# Patient Record
Sex: Female | Born: 1970
Health system: Southern US, Community
[De-identification: ages and names within clinical notes are randomized; demographics above are authoritative.]

## PROBLEM LIST (undated history)

## (undated) DIAGNOSIS — S0300XA Dislocation of jaw, unspecified side, initial encounter: Secondary | ICD-10-CM

## (undated) DIAGNOSIS — G44209 Tension-type headache, unspecified, not intractable: Secondary | ICD-10-CM

## (undated) DIAGNOSIS — O1490 Unspecified pre-eclampsia, unspecified trimester: Secondary | ICD-10-CM

## (undated) DIAGNOSIS — F419 Anxiety disorder, unspecified: Secondary | ICD-10-CM

## (undated) DIAGNOSIS — E039 Hypothyroidism, unspecified: Secondary | ICD-10-CM

## (undated) DIAGNOSIS — O24419 Gestational diabetes mellitus in pregnancy, unspecified control: Secondary | ICD-10-CM

## (undated) DIAGNOSIS — R87619 Unspecified abnormal cytological findings in specimens from cervix uteri: Secondary | ICD-10-CM

## (undated) DIAGNOSIS — I10 Essential (primary) hypertension: Secondary | ICD-10-CM

## (undated) HISTORY — DX: Gestational diabetes mellitus in pregnancy, unspecified control: O24.419

## (undated) HISTORY — DX: Essential (primary) hypertension: I10

## (undated) HISTORY — DX: Anxiety disorder, unspecified: F41.9

## (undated) HISTORY — DX: Tension-type headache, unspecified, not intractable: G44.209

## (undated) HISTORY — DX: Dislocation of jaw, unspecified side, initial encounter: S03.00XA

## (undated) HISTORY — DX: Unspecified pre-eclampsia, unspecified trimester: O14.90

## (undated) HISTORY — DX: Unspecified abnormal cytological findings in specimens from cervix uteri: R87.619

## (undated) HISTORY — DX: Hypothyroidism, unspecified: E03.9

---

## 2007-12-09 ENCOUNTER — Ambulatory Visit: Payer: Self-pay | Admitting: Family Medicine

## 2008-03-30 ENCOUNTER — Other Ambulatory Visit: Payer: Self-pay

## 2008-03-30 ENCOUNTER — Emergency Department: Payer: Self-pay | Admitting: Emergency Medicine

## 2008-09-30 ENCOUNTER — Emergency Department: Payer: Self-pay | Admitting: Emergency Medicine

## 2009-06-29 ENCOUNTER — Ambulatory Visit: Payer: Self-pay | Admitting: General Practice

## 2009-09-07 ENCOUNTER — Emergency Department: Payer: Self-pay | Admitting: Emergency Medicine

## 2010-04-15 ENCOUNTER — Emergency Department: Payer: Self-pay | Admitting: Emergency Medicine

## 2010-09-11 ENCOUNTER — Ambulatory Visit: Payer: Self-pay | Admitting: General Practice

## 2011-05-29 ENCOUNTER — Emergency Department: Payer: Self-pay

## 2012-03-01 ENCOUNTER — Emergency Department: Payer: Self-pay | Admitting: Emergency Medicine

## 2013-02-06 ENCOUNTER — Emergency Department: Payer: Self-pay | Admitting: Emergency Medicine

## 2013-02-15 ENCOUNTER — Encounter: Payer: Self-pay | Admitting: General Practice

## 2013-02-22 ENCOUNTER — Encounter: Payer: Self-pay | Admitting: General Practice

## 2013-02-26 ENCOUNTER — Emergency Department: Payer: Self-pay | Admitting: Emergency Medicine

## 2013-02-26 LAB — CBC
HCT: 37.4 % (ref 35.0–47.0)
MCH: 33.3 pg (ref 26.0–34.0)
MCHC: 35.3 g/dL (ref 32.0–36.0)
MCV: 95 fL (ref 80–100)
RBC: 3.96 10*6/uL (ref 3.80–5.20)
RDW: 12.4 % (ref 11.5–14.5)

## 2013-02-26 LAB — COMPREHENSIVE METABOLIC PANEL
Alkaline Phosphatase: 80 U/L (ref 50–136)
Anion Gap: 3 — ABNORMAL LOW (ref 7–16)
BUN: 7 mg/dL (ref 7–18)
Bilirubin,Total: 0.6 mg/dL (ref 0.2–1.0)
Calcium, Total: 9 mg/dL (ref 8.5–10.1)
Co2: 24 mmol/L (ref 21–32)
Creatinine: 0.56 mg/dL — ABNORMAL LOW (ref 0.60–1.30)
EGFR (Non-African Amer.): >60
Osmolality: 271 (ref 275–301)
Potassium: 3.6 mmol/L (ref 3.5–5.1)
SGPT (ALT): 20 U/L (ref 12–78)
Sodium: 137 mmol/L (ref 136–145)
Total Protein: 7.9 g/dL (ref 6.4–8.2)

## 2013-06-18 ENCOUNTER — Encounter: Payer: Self-pay | Admitting: *Deleted

## 2013-07-01 ENCOUNTER — Ambulatory Visit: Payer: Self-pay | Admitting: Podiatry

## 2014-02-08 ENCOUNTER — Ambulatory Visit: Payer: Self-pay | Admitting: Family Medicine

## 2014-04-06 DIAGNOSIS — M654 Radial styloid tenosynovitis [de Quervain]: Secondary | ICD-10-CM | POA: Insufficient documentation

## 2014-04-06 DIAGNOSIS — R768 Other specified abnormal immunological findings in serum: Secondary | ICD-10-CM | POA: Insufficient documentation

## 2014-05-25 DIAGNOSIS — G43109 Migraine with aura, not intractable, without status migrainosus: Secondary | ICD-10-CM | POA: Insufficient documentation

## 2014-05-25 DIAGNOSIS — E039 Hypothyroidism, unspecified: Secondary | ICD-10-CM | POA: Insufficient documentation

## 2014-06-02 DIAGNOSIS — R51 Headache: Secondary | ICD-10-CM

## 2014-06-02 DIAGNOSIS — R768 Other specified abnormal immunological findings in serum: Secondary | ICD-10-CM | POA: Insufficient documentation

## 2014-06-02 DIAGNOSIS — R519 Headache, unspecified: Secondary | ICD-10-CM | POA: Insufficient documentation

## 2014-09-02 ENCOUNTER — Emergency Department: Payer: Self-pay | Admitting: Emergency Medicine

## 2014-09-28 DIAGNOSIS — M2550 Pain in unspecified joint: Secondary | ICD-10-CM | POA: Insufficient documentation

## 2014-11-11 ENCOUNTER — Other Ambulatory Visit: Payer: Self-pay

## 2014-11-11 ENCOUNTER — Emergency Department
Admission: EM | Admit: 2014-11-11 | Discharge: 2014-11-11 | Disposition: A | Discharge status: Home / Self Care | Payer: 59 | Attending: Emergency Medicine | Admitting: Emergency Medicine

## 2014-11-11 ENCOUNTER — Encounter: Payer: Self-pay | Admitting: Emergency Medicine

## 2014-11-11 DIAGNOSIS — R002 Palpitations: Secondary | ICD-10-CM | POA: Diagnosis present

## 2014-11-11 DIAGNOSIS — Z880 Allergy status to penicillin: Secondary | ICD-10-CM | POA: Diagnosis not present

## 2014-11-11 DIAGNOSIS — R008 Other abnormalities of heart beat: Secondary | ICD-10-CM

## 2014-11-11 DIAGNOSIS — I493 Ventricular premature depolarization: Secondary | ICD-10-CM | POA: Insufficient documentation

## 2014-11-11 DIAGNOSIS — Z79899 Other long term (current) drug therapy: Secondary | ICD-10-CM | POA: Insufficient documentation

## 2014-11-11 DIAGNOSIS — R531 Weakness: Secondary | ICD-10-CM | POA: Insufficient documentation

## 2014-11-11 DIAGNOSIS — Z720 Tobacco use: Secondary | ICD-10-CM | POA: Diagnosis not present

## 2014-11-11 DIAGNOSIS — F419 Anxiety disorder, unspecified: Secondary | ICD-10-CM | POA: Insufficient documentation

## 2014-11-11 LAB — CBC
HCT: 36.6 % (ref 35.0–47.0)
HEMOGLOBIN: 12.7 g/dL (ref 12.0–16.0)
MCH: 32.6 pg (ref 26.0–34.0)
MCHC: 34.9 g/dL (ref 32.0–36.0)
MCV: 93.5 fL (ref 80.0–100.0)
Platelets: 233 10*3/uL (ref 150–440)
RBC: 3.91 MIL/uL (ref 3.80–5.20)
RDW: 12.6 % (ref 11.5–14.5)
WBC: 8.4 10*3/uL (ref 3.6–11.0)

## 2014-11-11 LAB — URINE DRUG SCREEN, QUALITATIVE (ARMC ONLY)
Amphetamines, Ur Screen: NOT DETECTED
BENZODIAZEPINE, UR SCRN: NOT DETECTED
Barbiturates, Ur Screen: NOT DETECTED
Cannabinoid 50 Ng, Ur ~~LOC~~: NOT DETECTED
Cocaine Metabolite,Ur ~~LOC~~: NOT DETECTED
MDMA (ECSTASY) UR SCREEN: NOT DETECTED
Methadone Scn, Ur: NOT DETECTED
OPIATE, UR SCREEN: NOT DETECTED
PHENCYCLIDINE (PCP) UR S: NOT DETECTED
Tricyclic, Ur Screen: NOT DETECTED

## 2014-11-11 LAB — URINALYSIS COMPLETE WITH MICROSCOPIC (ARMC ONLY)
Bacteria, UA: NONE SEEN
Bilirubin Urine: NEGATIVE
Glucose, UA: NEGATIVE mg/dL
HGB URINE DIPSTICK: NEGATIVE
Ketones, ur: NEGATIVE mg/dL
Leukocytes, UA: NEGATIVE
Nitrite: NEGATIVE
PROTEIN: NEGATIVE mg/dL
RBC / HPF: NONE SEEN RBC/hpf (ref 0–5)
SPECIFIC GRAVITY, URINE: 1.003 — AB (ref 1.005–1.030)
SQUAMOUS EPITHELIAL / LPF: NONE SEEN
pH: 6 (ref 5.0–8.0)

## 2014-11-11 LAB — BASIC METABOLIC PANEL
Anion gap: 7 (ref 5–15)
BUN: 14 mg/dL (ref 6–20)
CALCIUM: 9.3 mg/dL (ref 8.9–10.3)
CO2: 27 mmol/L (ref 22–32)
Chloride: 104 mmol/L (ref 101–111)
Creatinine, Ser: 0.63 mg/dL (ref 0.44–1.00)
GFR calc Af Amer: >60 mL/min (ref >60)
Glucose, Bld: 128 mg/dL — ABNORMAL HIGH (ref 65–99)
Potassium: 2.9 mmol/L — CL (ref 3.5–5.1)
Sodium: 138 mmol/L (ref 135–145)

## 2014-11-11 LAB — MAGNESIUM: Magnesium: 1.9 mg/dL (ref 1.7–2.4)

## 2014-11-11 LAB — TROPONIN I: Troponin I: <0.03 ng/mL (ref <0.031)

## 2014-11-11 LAB — TSH: TSH: 1.062 u[IU]/mL (ref 0.350–4.500)

## 2014-11-11 MED ORDER — MAGNESIUM OXIDE 400 (241.3 MG) MG PO TABS
ORAL_TABLET | ORAL | Status: AC
  Filled 2014-11-11: qty 2

## 2014-11-11 MED ORDER — POTASSIUM CHLORIDE CRYS ER 20 MEQ PO TBCR
EXTENDED_RELEASE_TABLET | ORAL | Status: AC
  Filled 2014-11-11: qty 2

## 2014-11-11 MED ORDER — POTASSIUM CHLORIDE CRYS ER 20 MEQ PO TBCR
40.0000 meq | EXTENDED_RELEASE_TABLET | Freq: Once | ORAL | Status: AC
  Administered 2014-11-11: 40 meq via ORAL

## 2014-11-11 MED ORDER — POTASSIUM CHLORIDE ER 10 MEQ PO TBCR: 10.0000 meq | EXTENDED_RELEASE_TABLET | Freq: Two times a day (BID) | ORAL | Status: AC

## 2014-11-11 MED ORDER — MAGNESIUM OXIDE 400 (241.3 MG) MG PO TABS
800.0000 mg | ORAL_TABLET | Freq: Once | ORAL | Status: AC
  Administered 2014-11-11: 800 mg via ORAL

## 2014-11-11 NOTE — ED Provider Notes (Signed)
[facility] Emergency Department Provider Note  ____________________________________________  Time seen: 4 PM  I have reviewed the triage vital signs and the nursing notes.   HISTORY  Chief Complaint Tachycardia and Dizziness   HPI Tiffany Bradford is a 44 y.o. female who was driving home from work when she felt palpitations and dizziness. This happened this early afternoon. The symptoms have continued intermittently. She had stopped the car. She felt she had spots or darkness in her vision. She felt like she was weak and anxious. She had symptoms like this previously. Usually it is only palpitations. She does have a history of hypothyroidism and takes thyroid replacement. She has a history of anxiety and takes clonazepam.  She denies any chest pain. She denies any swelling to either leg. She does have a urinary tract infection that she is currently being treated with ciprofloxacin with.    Past Medical History  Diagnosis Date  . Tension headache   . Anxiety     There are no active problems to display for this patient.   No past surgical history on file.  Current Outpatient Rx  Name  Route  Sig  Dispense  Refill  . cyclobenzaprine (FLEXERIL) 10 MG tablet   Oral   Take 10 mg by mouth at bedtime.         Marland Kitchen. ibuprofen (ADVIL,MOTRIN) 600 MG tablet   Oral   Take 600 mg by mouth every 6 (six) hours as needed.         . potassium chloride (K-DUR) 10 MEQ tablet   Oral   Take 1 tablet (10 mEq total) by mouth 2 (two) times daily.   20 tablet   0     Allergies Penicillins  No family history on file.  Social History History  Substance Use Topics  . Smoking status: Current Every Day Smoker    Types: Cigarettes  . Smokeless tobacco: Not on file  . Alcohol Use: No    Review of Systems  Constitutional: Negative for fever. ENT: Negative for sore throat. Cardiovascular: Palpitations and tachycardia.  Respiratory: Negative for shortness  of breath. Gastrointestinal: Negative for abdominal pain, vomiting and diarrhea. Genitourinary: Negative for dysuria. Musculoskeletal: Negative for back pain. Skin: Negative for rash. Neurological: General weakness, anxiety. See history of present illness.   10-point ROS otherwise negative.  ____________________________________________   PHYSICAL EXAM:  VITAL SIGNS: ED Triage Vitals  Enc Vitals Group     BP --      Pulse --      Resp --      Temp --      Temp src --      SpO2 --      Weight 11/11/14 1527 130 lb (58.968 kg)     Height 11/11/14 1527 5' (1.524 m)     Head Cir --      Peak Flow --      Pain Score --      Pain Loc --      Pain Edu? --      Excl. in GC? --     Constitutional: Alert and oriented. Well appearing and in no distress. ENT   Head: Normocephalic and atraumatic.   Nose: No congestion/rhinnorhea.   Mouth/Throat: Mucous membranes are moist. Cardiovascular: Patient has a regular rhythm and rate for the most part. Her heart rate is 90 and 94. There are only occasional PVCs. This differs from the initial EKG on her arrival which showed bigeminy. Respiratory:  Normal respiratory effort without tachypnea. Breath sounds are clear and equal bilaterally. No wheezes/rales/rhonchi. Gastrointestinal: Soft and nontender. No distention.  Back: There is no CVA tenderness. Musculoskeletal: Nontender with normal range of motion in all extremities.  No noted edema. Neurologic:  Normal speech and language. No gross focal neurologic deficits are appreciated.  Skin:  Skin is warm, dry. No rash noted. Psychiatric: Mood and affect are normal. Speech and behavior are normal.  ____________________________________________    LABS (pertinent positives/negatives)  CBC is normal with white blood cell count of 8.4 and hemoglobin of 12.7.  Metabolic panel is overall normal except for a slightly low potassium level at 2.9 and glucose 128. Troponin is  negative.  Urinalysis is negative. ____________________________________________   EKG  I have interpreted this EKG myself. EKG at 1534 shows sinus tachycardia with frequent premature ventricular complexes in bigeminy. Rate of 111. QTC of 508 area and axis is normal.  ____________________________________________    ____________________________________________   PROCEDURES  ____________________________________________   INITIAL IMPRESSION / ASSESSMENT AND PLAN / ED COURSE  Unclear cause of tachycardia with PVCs. The patient has had similar symptoms in the past. It may be related to her thyroid. Concerned that she may have a medication interaction with her current ciprofloxacin that is triggering this. We are checking electrolytes and magnesium level.  ----------------------------------------- 7:06 PM on 11/11/2014 -----------------------------------------  Recheck of patient shows she is doing better. I saw no PVCs while in the room for approximately 3 minutes. She reports that the palpitations has decreased. Her heart rate is in the mid 80s. We have given her a potassium pill and a magnesium pill. we will continue her on these supplements for the next 5 days. She will follow up with her regular physician.  ____________________________________________   FINAL CLINICAL IMPRESSION(S) / ED DIAGNOSES  Final diagnoses:  Heart palpitations  Premature ventricular contractions  Ventricular bigeminy seen on cardiac monitor      Darien Ramusavid W Sencere Symonette, MD 11/11/14 [phone]

## 2014-11-11 NOTE — ED Notes (Signed)
K 2.9 reported to Dr. Carollee MassedKaminski verbally.

## 2014-11-11 NOTE — ED Notes (Signed)
Pt states she was driving home from working, states she started to have dizziness and her heart felt like it was racing, states "I just don't feel well", denies any cp or sob, pt appears anxious

## 2014-11-11 NOTE — Discharge Instructions (Signed)
Take the potassium replacement prescribed twice a day for 5 days. You may also take magnesium, 250-400 mg twice a day. Follow-up with your regular doctor. Return to the emergency department if you have any further weakness, prolonged palpitations, or other urgent concerns.

## 2014-12-23 IMAGING — CR DG LUMBAR SPINE 2-3V
1 series · 3 of 3 positions shown · non-contrast
Comparison: none

REASON FOR EXAM: acute LBP 2nd to lifting incident
COMMENTS:   LMP: Two weeks ago

[Series 1: t lumbar spine ap · 0.14mm/px · 3 of 3 slices shown]
[im 1/3]
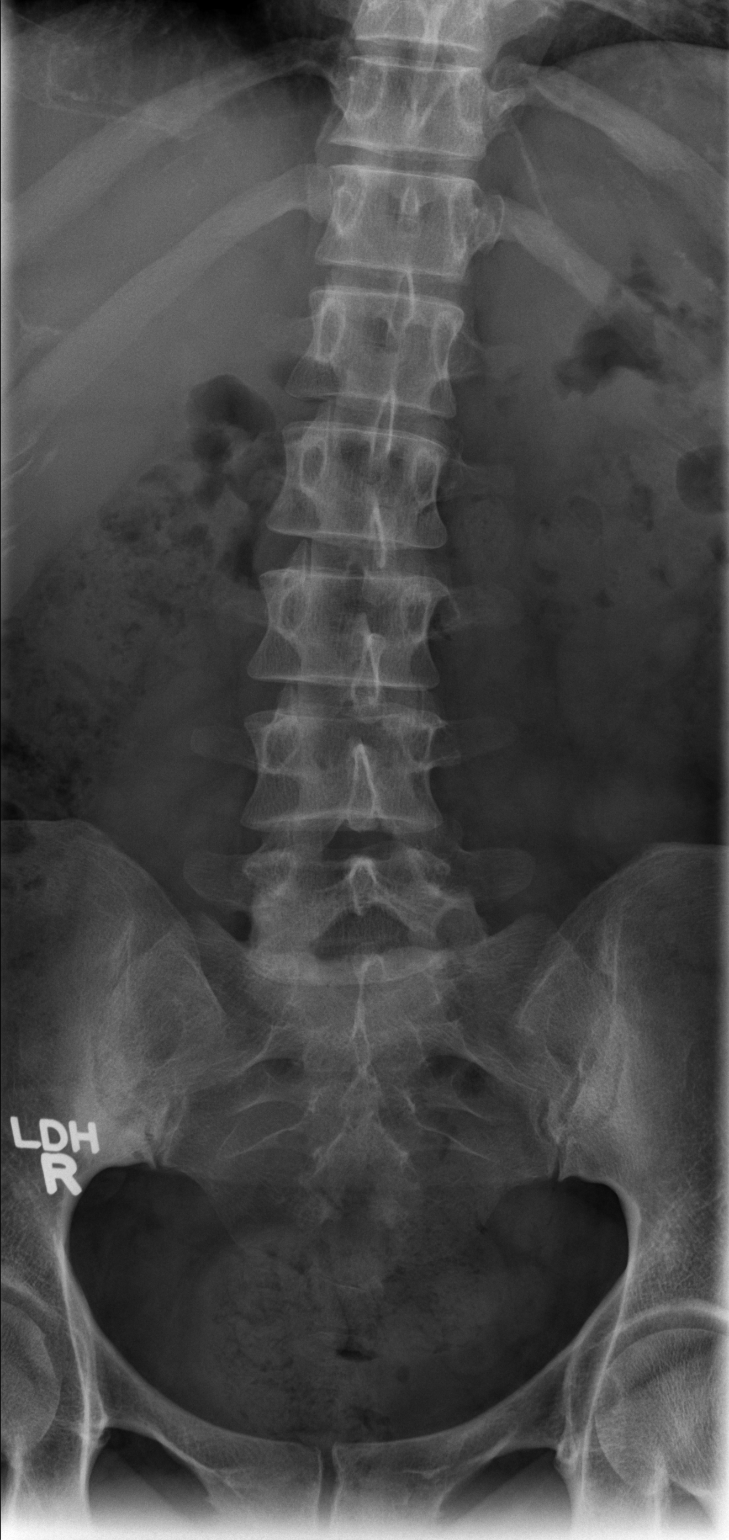
[im 2/3]
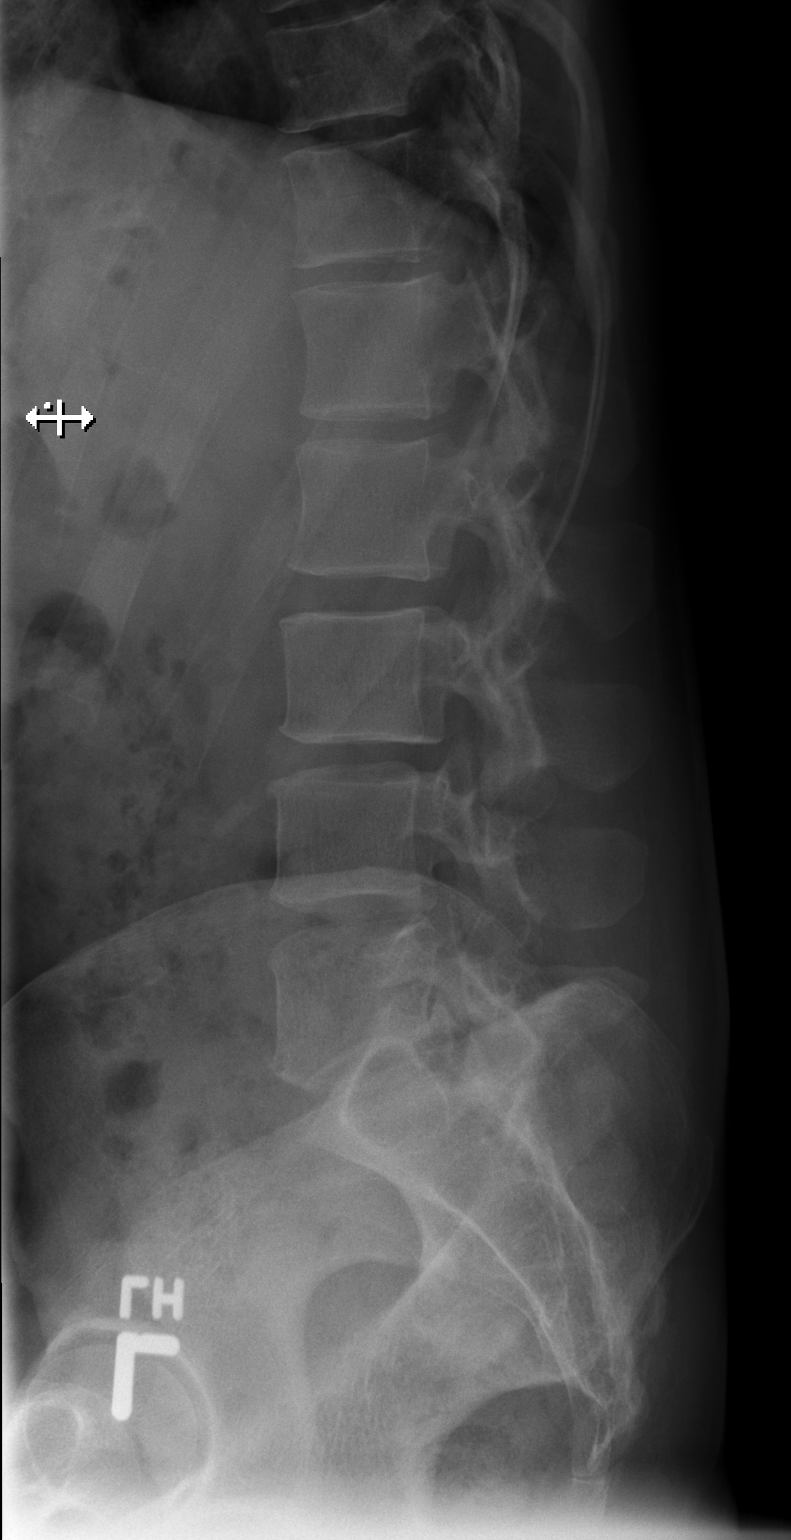
[im 3/3]
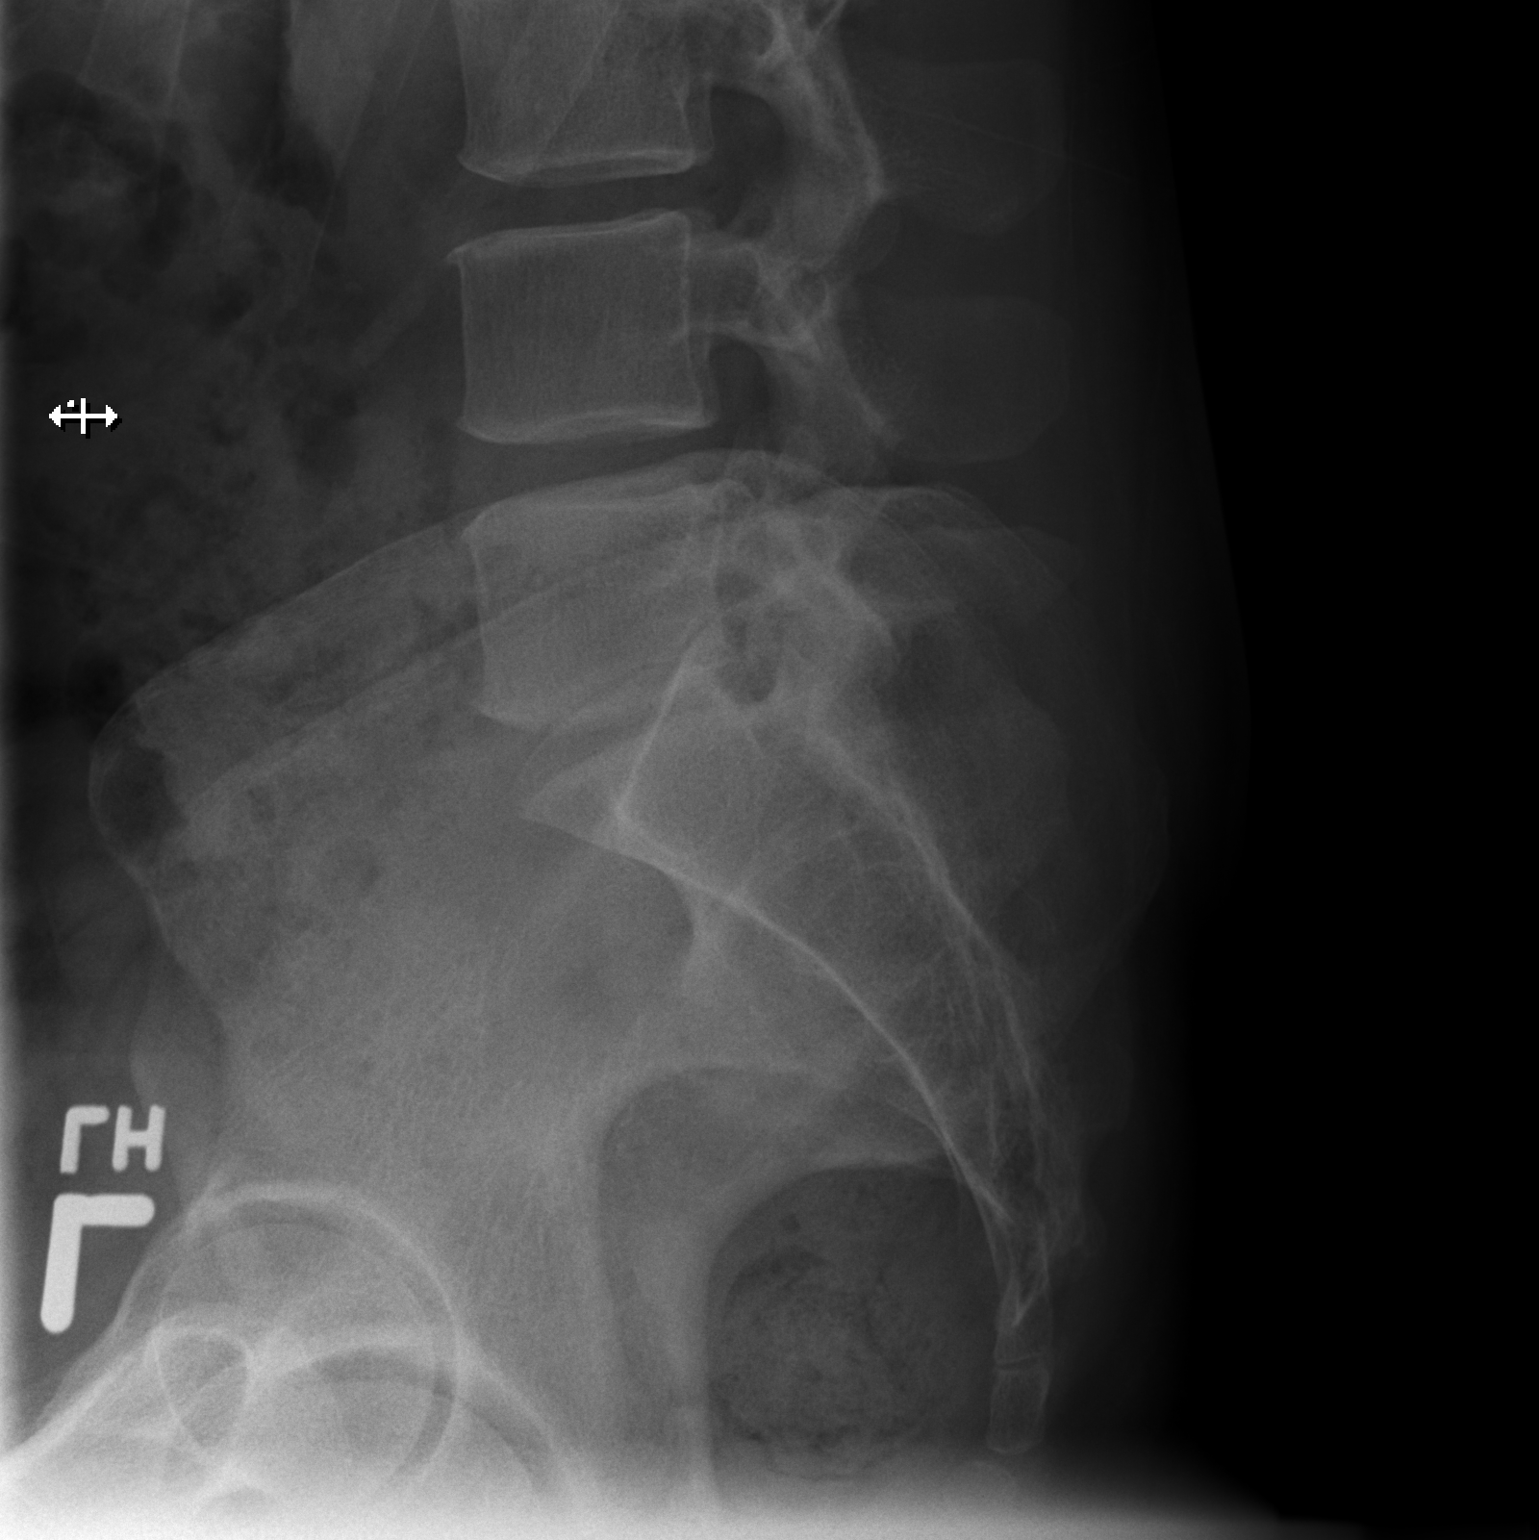

[3 of 3 positions shown; findings below may reference images not displayed]

PROCEDURE:     DXR - DXR LUMBAR SPINE AP AND LATERAL  - February 06, 2013  [DATE]

RESULT:     Images of the lumbar spine show no compression fracture or
subluxation. There is no lytic or sclerotic bony lesion. The bowel gas
pattern appears unremarkable. Some sacroiliac sclerosis is present
consistent with degenerative change.
IMPRESSION: No acute bony abnormality of the lumbar spine evident.

[REDACTED]

## 2014-12-27 ENCOUNTER — Ambulatory Visit: Payer: 59 | Admitting: Family Medicine

## 2015-01-05 ENCOUNTER — Other Ambulatory Visit: Payer: Self-pay | Admitting: Family Medicine

## 2015-01-05 ENCOUNTER — Ambulatory Visit
Admission: RE | Admit: 2015-01-05 | Discharge: 2015-01-05 | Disposition: A | Discharge status: Home / Self Care | Payer: 59 | Source: Ambulatory Visit | Attending: Family Medicine | Admitting: Family Medicine

## 2015-01-05 DIAGNOSIS — M25561 Pain in right knee: Secondary | ICD-10-CM | POA: Diagnosis not present

## 2015-01-05 DIAGNOSIS — M1711 Unilateral primary osteoarthritis, right knee: Secondary | ICD-10-CM | POA: Insufficient documentation

## 2015-01-19 ENCOUNTER — Inpatient Hospital Stay: Payer: 59 | Admitting: Family Medicine

## 2015-06-04 ENCOUNTER — Encounter: Payer: Self-pay | Admitting: Emergency Medicine

## 2015-06-04 ENCOUNTER — Emergency Department
Admission: EM | Admit: 2015-06-04 | Discharge: 2015-06-04 | Disposition: A | Discharge status: Home / Self Care | Payer: 59 | Attending: Emergency Medicine | Admitting: Emergency Medicine

## 2015-06-04 ENCOUNTER — Emergency Department: Payer: 59

## 2015-06-04 DIAGNOSIS — R51 Headache: Secondary | ICD-10-CM | POA: Diagnosis present

## 2015-06-04 DIAGNOSIS — F17210 Nicotine dependence, cigarettes, uncomplicated: Secondary | ICD-10-CM | POA: Insufficient documentation

## 2015-06-04 DIAGNOSIS — Z880 Allergy status to penicillin: Secondary | ICD-10-CM | POA: Diagnosis not present

## 2015-06-04 DIAGNOSIS — G500 Trigeminal neuralgia: Secondary | ICD-10-CM | POA: Insufficient documentation

## 2015-06-04 DIAGNOSIS — Z3202 Encounter for pregnancy test, result negative: Secondary | ICD-10-CM | POA: Insufficient documentation

## 2015-06-04 DIAGNOSIS — Z79899 Other long term (current) drug therapy: Secondary | ICD-10-CM | POA: Insufficient documentation

## 2015-06-04 LAB — BASIC METABOLIC PANEL
Anion gap: 5 (ref 5–15)
BUN: 14 mg/dL (ref 6–20)
CO2: 26 mmol/L (ref 22–32)
Calcium: 9.3 mg/dL (ref 8.9–10.3)
Chloride: 107 mmol/L (ref 101–111)
Creatinine, Ser: 0.44 mg/dL (ref 0.44–1.00)
GFR calc Af Amer: >60 mL/min (ref >60)
Glucose, Bld: 97 mg/dL (ref 65–99)
POTASSIUM: 3.6 mmol/L (ref 3.5–5.1)
Sodium: 138 mmol/L (ref 135–145)

## 2015-06-04 LAB — CBC WITH DIFFERENTIAL/PLATELET
Basophils Absolute: 0.1 10*3/uL (ref 0–0.1)
Basophils Relative: 1 %
EOS PCT: 1 %
Eosinophils Absolute: 0.1 10*3/uL (ref 0–0.7)
HEMATOCRIT: 35.6 % (ref 35.0–47.0)
Hemoglobin: 12.7 g/dL (ref 12.0–16.0)
LYMPHS PCT: 24 %
Lymphs Abs: 1.8 10*3/uL (ref 1.0–3.6)
MCH: 35.6 pg — ABNORMAL HIGH (ref 26.0–34.0)
MCHC: 35.6 g/dL (ref 32.0–36.0)
MCV: 99.9 fL (ref 80.0–100.0)
MONO ABS: 0.5 10*3/uL (ref 0.2–0.9)
Monocytes Relative: 6 %
Neutro Abs: 5.1 10*3/uL (ref 1.4–6.5)
Neutrophils Relative %: 68 %
PLATELETS: 262 10*3/uL (ref 150–440)
RBC: 3.56 MIL/uL — ABNORMAL LOW (ref 3.80–5.20)
RDW: 12.9 % (ref 11.5–14.5)
WBC: 7.5 10*3/uL (ref 3.6–11.0)

## 2015-06-04 LAB — POCT PREGNANCY, URINE: Preg Test, Ur: NEGATIVE

## 2015-06-04 LAB — SEDIMENTATION RATE: Sed Rate: 37 mm/hr — ABNORMAL HIGH (ref 0–20)

## 2015-06-04 MED ORDER — LORAZEPAM 1 MG PO TABS
1.0000 mg | ORAL_TABLET | Freq: Once | ORAL | Status: AC
  Administered 2015-06-04: 1 mg via ORAL
  Filled 2015-06-04: qty 1

## 2015-06-04 MED ORDER — GABAPENTIN 300 MG PO CAPS: 300.0000 mg | ORAL_CAPSULE | Freq: Two times a day (BID) | ORAL | Status: AC

## 2015-06-04 NOTE — ED Notes (Signed)
Patient back from MRI.

## 2015-06-04 NOTE — ED Provider Notes (Signed)
[facility] Emergency Department Provider Note REMINDER - THIS NOTE IS NOT A FINAL MEDICAL RECORD UNTIL IT IS SIGNED. UNTIL THEN, THE CONTENT BELOW MAY REFLECT INFORMATION FROM A DOCUMENTATION TEMPLATE, NOT THE ACTUAL PATIENT VISIT. ____________________________________________  Time seen: Approximately 12:59 PM  I have reviewed the triage vital signs and the nursing notes.   HISTORY  Chief Complaint Headache and Tingling    HPI Tiffany Bradford is a 44 y.o. female reports a previous history of hypertension as well as anxiety.  Patient reports that starting last night around 7 PM she's been having episodes of shooting pain that seems to shoot out from the area around the ear up over the left temple. She has not had a numbness, weakness or tingling except for a tingling sensation over the left side of the head. No fevers or chills. No trouble breathing. Denies having a headache, and at the present time has no pain or symptoms but reports it will come and go a couple times an hour and usually last for about a minute or 2.  She is not pregnant. She is taking a blood pressure medicine only.  No trouble Troubles walking or speaking.  Past Medical History  Diagnosis Date  . Tension headache   . Anxiety     There are no active problems to display for this patient.   No past surgical history on file.  Current Outpatient Rx  Name  Route  Sig  Dispense  Refill  . cyclobenzaprine (FLEXERIL) 10 MG tablet   Oral   Take 10 mg by mouth at bedtime.         . gabapentin (NEURONTIN) 300 MG capsule   Oral   Take 1 capsule (300 mg total) by mouth 2 (two) times daily.   28 capsule   0   . ibuprofen (ADVIL,MOTRIN) 600 MG tablet   Oral   Take 600 mg by mouth every 6 (six) hours as needed.         . potassium chloride (K-DUR) 10 MEQ tablet   Oral   Take 1 tablet (10 mEq total) by mouth 2 (two) times daily.   20 tablet   0      Allergies Penicillins  No family history on file.  Social History Social History  Substance Use Topics  . Smoking status: Current Every Day Smoker    Types: Cigarettes  . Smokeless tobacco: None  . Alcohol Use: No    Review of Systems Constitutional: No fever/chills Eyes: No visual changes. ENT: No sore throat. Cardiovascular: Denies chest pain. Respiratory: Denies shortness of breath. Gastrointestinal: No abdominal pain.  No nausea, no vomiting.  No diarrhea.  No constipation. Genitourinary: Negative for dysuria. Musculoskeletal: Negative for back pain. Skin: Negative for rash. Neurological: Negative for focal weakness  10-point ROS otherwise negative.  ____________________________________________   PHYSICAL EXAM:  VITAL SIGNS: ED Triage Vitals  Enc Vitals Group     BP 06/04/15 1126 154/70 mmHg     Pulse Rate 06/04/15 1126 83     Resp 06/04/15 1126 18     Temp 06/04/15 1126 98.1 F (36.7 C)     Temp Source 06/04/15 1126 Oral     SpO2 06/04/15 1126 100 %     Weight --      Height --      Head Cir --      Peak Flow --      Pain Score 06/04/15 1126 6     Pain  Loc --      Pain Edu? --      Excl. in [place]? --    Constitutional: Alert and oriented. Well appearing and in no acute distress. Eyes: Conjunctivae are normal. PERRL. EOMI. Head: Atraumatic. Nose: No congestion/rhinnorhea. Mouth/Throat: Mucous membranes are moist.  Oropharynx non-erythematous. No temporal artery tenderness. Normal temporal artery pulsations bilaterally. Neck: No stridor.   Cardiovascular: Normal rate, regular rhythm. Grossly normal heart sounds.  Good peripheral circulation. Respiratory: Normal respiratory effort.  No retractions. Lungs CTAB. Gastrointestinal: Soft and nontender. No distention. No abdominal bruits. No CVA tenderness. Musculoskeletal: No lower extremity tenderness nor edema.   Neurologic:  Normal speech and language. No gross focal neurologic deficits are  appreciated. No gait instability.  NIH score equals 0, performed by me at bedside. The patient has no pronator drift. The patient has normal cranial nerve exam. Extraocular movements are normal. Visual fields are normal. Patient has 5 out of 5 strength in all extremities. There is no numbness or gross, acute sensory abnormality in the extremities bilaterally. No speech disturbance. No dysarthria. No aphasia. No ataxia. Normal finger nose finger bilat. Patient speaking in full and clear sentences.   Skin:  Skin is warm, dry and intact. No rash noted. Psychiatric: Mood and affect are normal. Speech and behavior are normal.  ____________________________________________   LABS (all labs ordered are listed, but only abnormal results are displayed)  Labs Reviewed  CBC WITH DIFFERENTIAL/PLATELET - Abnormal; Notable for the following:    RBC 3.56 (*)    MCH 35.6 (*)    All other components within normal limits  SEDIMENTATION RATE - Abnormal; Notable for the following:    Sed Rate 37 (*)    All other components within normal limits  BASIC METABOLIC PANEL  POC URINE PREG, ED  POCT PREGNANCY, URINE   ____________________________________________  EKG   ____________________________________________  RADIOLOGY     MR Brain Wo Contrast (Final result) Result time: 06/04/15 14:46:59   Final result by Rad Results In Interface (06/04/15 14:46:59)   Narrative:   CLINICAL DATA: 44 year old female with numbness burning and tingling of the head since last night. Left upper extremity tingling for 12 hours. No known injury. Initial encounter.  EXAM: MRI HEAD WITHOUT CONTRAST  TECHNIQUE: Multiplanar, multiecho pulse sequences of the brain and surrounding structures were obtained without intravenous contrast.  COMPARISON: Head CT without contrast 02/26/2013. Brain MRI 05/29/2011.  FINDINGS: Cerebral volume remains normal. Major intracranial vascular flow voids are within  normal limits. Pneumatized right anterior clinoid process re- demonstrated. No restricted diffusion to suggest acute infarction. No midline shift, mass effect, evidence of mass lesion, ventriculomegaly, extra-axial collection or acute intracranial hemorrhage. Cervicomedullary junction and pituitary are within normal limits. Negative visualized cervical spine.  Pearline Cables and white matter signal is stable and within normal limits for age.  Visible internal auditory structures appear normal. Mastoids are clear. Paranasal sinuses are clear. Orbit soft tissues are within normal limits. Visualized bone marrow signal is within normal limits.  There is metal susceptibility artifact at the left vertex of the scalp which was not present in 2012 and is not correlated with any finding on the 2014 head CT.  IMPRESSION: 1. No acute intracranial abnormality. Stable and normal for age noncontrast MRI appearance of the brain. 2. New metal susceptibility artifact at the left vertex since 2014. Query new retained foreign body.    ____________________________________________   PROCEDURES  Procedure(s) performed: None  Critical Care performed: No  ____________________________________________   INITIAL  IMPRESSION / ASSESSMENT AND PLAN / ED COURSE  Pertinent labs & imaging results that were available during my care of the patient were reviewed by me and considered in my medical decision making (see chart for details).  Patient presents for staining and paresthesia-like pain over the left temporal region. Neuro intact, normal neurologic exam this time. The pain in distribution over the temporal region that she describes sounds quite a bit like trigeminal neuralgia. I can't explain well the intermittent tingling she experiences in the left forearm, and I discussed her case and care with Dr. Macie Burows, of neurology who advises imaging. If MRI is normal. Advised discharging her on 200 mg Neurontin 3 times  a day with follow-up in neurology clinic. I think this is advisable, I most suspect trigeminal neuralgia as well. We'll obtain MRI to evaluate and further exclude other etiologies such as mild stroke, multiple sclerosis.  ----------------------------------------- 2:53 PM on 06/04/2015 -----------------------------------------  Follow-up with neurology discussed with the patient. Careful return precautions advised. MRI reassuring, no defects. ESR is less than 50, no significant risk for giant cell arteritis. Exam reassuring. Discharge the patient home with a prescription for Neurontin) follow-up. ____________________________________________   FINAL CLINICAL IMPRESSION(S) / ED DIAGNOSES  Final diagnoses:  Trigeminal neuralgia of left side of face      Delman Kitten, MD 06/04/15 1930

## 2015-06-04 NOTE — Discharge Instructions (Signed)
Neuralgia del trigmino (Trigeminal Neuralgia) La neuralgia del trigmino es un trastorno nervioso que causa crisis de dolor facial intenso. Las crisis pueden durar unos segundos o varios minutos. Pueden ocurrir [address], [address] Circlesemanas o Toledomeses, y Clinical biochemistluego desaparecer durante meses o aos. A la neuralgia del trigmino tambin se la conoce como tic doloroso. CAUSAS La causa de esta afeccin es el dao a un nervio del rostro llamado trigmino. Las siguientes acciones pueden desencadenar una crisis:  Hablar.  Masticar.  Maquillarse.  Lavarse el rostro.  Afeitarse el rostro.  Cepillarse los dientes.  Tocarse el rostro. FACTORES DE RIESGO Es ms probable que esta afeccin se manifieste en:  Las mujeres.  Las Smith Internationalpersonas mayores de [id number]. SNTOMAS El sntoma principal de esta afeccin es el dolor en la Pattisonmandbula, los labios, los ojos, la Barboursvillenariz, el cuero cabelludo, la frente y Recruitment consultantel rostro. El dolor puede ser intenso, punzante o similar a un choque elctrico. DIAGNSTICO Esta afeccin se diagnostica mediante un examen fsico. Se puede realizar una tomografa computarizada (TC) o una resonancia magntica (RM) para Engineer, productiondescartar la presencia de otros trastornos que pueden causar Armed forces technical officerdolor facial. TRATAMIENTO El tratamiento de esta afeccin puede incluir lo siguiente:  Automotive engineervitar las cosas que desencadenan las crisis.  Analgsicos.  Ciruga. Esta puede realizarse Circuit Citycuando los casos son graves, si otro tratamiento mdico no brinda alivio. INSTRUCCIONES PARA EL CUIDADO EN EL HOGAR  Tome los medicamentos de venta libre y los recetados solamente como se lo haya indicado el mdico.  Si desea quedar embarazada, hable con el mdico antes de intentarlo.  Evite los factores desencadenantes de las crisis. Puede ser de ayuda:  Masticar del lado de la boca que no est afectado.  Evitar tocarse el rostro.  Evitar las rfagas de aire fro o caliente. SOLICITE ATENCIN MDICA SI:  El medicamento no Hotel managerle calma el  dolor.  Le aparecen sntomas nuevos que no se pueden explicar, por ejemplo:  Visin doble.  Debilidad facial.  Cambios en la audicin o el equilibrio.  Ardelle AntonQueda embarazada. SOLICITE ATENCIN MDICA DE INMEDIATO SI:  El dolor es insoportable, y el analgsico no lo [place]ivia.   Esta informacin no tiene Theme park managercomo fin reemplazar el consejo del mdico. Asegrese de hacerle al mdico cualquier pregunta que tenga.   Document Released: 03/20/2005 Document Revised: 03/01/2015 Elsevier Interactive Patient Education [employer]2016 Elsevier Inc.

## 2015-06-04 NOTE — ED Notes (Signed)
Patient transported to MRI with husband

## 2015-06-04 NOTE — ED Notes (Signed)
Pt reports numbness,burning, tingling in head that started last night.

## 2015-06-08 ENCOUNTER — Ambulatory Visit: Payer: Self-pay | Admitting: Physician Assistant

## 2015-06-08 ENCOUNTER — Encounter: Payer: Self-pay | Admitting: Physician Assistant

## 2015-06-08 VITALS — BP 124/80 | HR 72 | Temp 98.3°F

## 2015-06-08 DIAGNOSIS — J069 Acute upper respiratory infection, unspecified: Secondary | ICD-10-CM

## 2015-06-08 MED ORDER — BENZONATATE 200 MG PO CAPS: 200.0000 mg | ORAL_CAPSULE | Freq: Three times a day (TID) | ORAL | Status: DC | PRN

## 2015-06-08 MED ORDER — AZITHROMYCIN 250 MG PO TABS: ORAL_TABLET | ORAL | Status: DC

## 2015-06-08 NOTE — Progress Notes (Signed)
S: C/o runny nose and congestion for 4 days, no fever, chills, cp/sob, v/d; mucus is green and thick, cough is sporadic, c/o of facial and dental pain.   Using otc meds:   O: PE: vitals wnl, nad,  perrl eomi, normocephalic, tms dull, nasal mucosa red and swollen, throat injected, neck supple no lymph, lungs c t a, cv rrr, neuro intact  A:  Acute sinusitis   P: zpack, tessalon perls, drink fluids, continue regular meds , use otc meds of choice, return if not improving in 5 days, return earlier if worsening

## 2015-06-28 DIAGNOSIS — R51 Headache: Secondary | ICD-10-CM | POA: Diagnosis not present

## 2015-06-28 DIAGNOSIS — M654 Radial styloid tenosynovitis [de Quervain]: Secondary | ICD-10-CM | POA: Diagnosis not present

## 2015-06-28 DIAGNOSIS — G43109 Migraine with aura, not intractable, without status migrainosus: Secondary | ICD-10-CM | POA: Diagnosis not present

## 2015-06-28 DIAGNOSIS — L659 Nonscarring hair loss, unspecified: Secondary | ICD-10-CM | POA: Diagnosis not present

## 2015-06-28 DIAGNOSIS — R768 Other specified abnormal immunological findings in serum: Secondary | ICD-10-CM | POA: Diagnosis not present

## 2015-06-28 DIAGNOSIS — R109 Unspecified abdominal pain: Secondary | ICD-10-CM | POA: Diagnosis not present

## 2015-06-28 DIAGNOSIS — E038 Other specified hypothyroidism: Secondary | ICD-10-CM | POA: Diagnosis not present

## 2015-07-12 ENCOUNTER — Encounter: Payer: Self-pay | Admitting: Physician Assistant

## 2015-07-12 ENCOUNTER — Ambulatory Visit: Payer: Self-pay | Admitting: Physician Assistant

## 2015-07-12 VITALS — BP 130/70 | HR 74 | Temp 98.0°F

## 2015-07-12 DIAGNOSIS — R300 Dysuria: Secondary | ICD-10-CM

## 2015-07-12 LAB — POCT URINALYSIS DIPSTICK
BILIRUBIN UA: NEGATIVE
Blood, UA: NEGATIVE
GLUCOSE UA: NEGATIVE
Ketones, UA: NEGATIVE
LEUKOCYTES UA: NEGATIVE
Nitrite, UA: NEGATIVE
Protein, UA: NEGATIVE
Urobilinogen, UA: 0.2
pH, UA: 6

## 2015-07-12 NOTE — Progress Notes (Signed)
S: c/o low back pain, some burning with urination, no fever/chills, no vaginal discharge, no abd pain  O: vitals wnl, nad, no cva tenderness, lower back nontender to palp, full rom, n/v intact  A: dysuria  P: otc meds

## 2015-07-26 DIAGNOSIS — G44229 Chronic tension-type headache, not intractable: Secondary | ICD-10-CM | POA: Diagnosis not present

## 2015-07-26 DIAGNOSIS — G43109 Migraine with aura, not intractable, without status migrainosus: Secondary | ICD-10-CM | POA: Diagnosis not present

## 2015-07-26 DIAGNOSIS — R51 Headache: Secondary | ICD-10-CM | POA: Diagnosis not present

## 2015-08-22 DIAGNOSIS — J8410 Pulmonary fibrosis, unspecified: Secondary | ICD-10-CM | POA: Diagnosis not present

## 2015-08-22 DIAGNOSIS — R0789 Other chest pain: Secondary | ICD-10-CM | POA: Diagnosis not present

## 2015-08-22 DIAGNOSIS — M329 Systemic lupus erythematosus, unspecified: Secondary | ICD-10-CM | POA: Diagnosis not present

## 2015-08-22 DIAGNOSIS — Z79899 Other long term (current) drug therapy: Secondary | ICD-10-CM | POA: Diagnosis not present

## 2015-08-22 DIAGNOSIS — E039 Hypothyroidism, unspecified: Secondary | ICD-10-CM | POA: Diagnosis not present

## 2015-08-22 DIAGNOSIS — M25512 Pain in left shoulder: Secondary | ICD-10-CM | POA: Diagnosis not present

## 2015-08-22 DIAGNOSIS — F419 Anxiety disorder, unspecified: Secondary | ICD-10-CM | POA: Diagnosis not present

## 2015-08-22 DIAGNOSIS — Z880 Allergy status to penicillin: Secondary | ICD-10-CM | POA: Diagnosis not present

## 2015-08-22 DIAGNOSIS — R079 Chest pain, unspecified: Secondary | ICD-10-CM | POA: Diagnosis not present

## 2015-09-08 DIAGNOSIS — K649 Unspecified hemorrhoids: Secondary | ICD-10-CM | POA: Diagnosis not present

## 2015-09-29 DIAGNOSIS — G44229 Chronic tension-type headache, not intractable: Secondary | ICD-10-CM | POA: Diagnosis not present

## 2015-09-29 DIAGNOSIS — G5622 Lesion of ulnar nerve, left upper limb: Secondary | ICD-10-CM | POA: Diagnosis not present

## 2015-09-29 DIAGNOSIS — S29011A Strain of muscle and tendon of front wall of thorax, initial encounter: Secondary | ICD-10-CM | POA: Diagnosis not present

## 2015-12-29 DIAGNOSIS — F419 Anxiety disorder, unspecified: Secondary | ICD-10-CM | POA: Diagnosis not present

## 2015-12-29 DIAGNOSIS — G44229 Chronic tension-type headache, not intractable: Secondary | ICD-10-CM | POA: Diagnosis not present

## 2016-01-12 DIAGNOSIS — K594 Anal spasm: Secondary | ICD-10-CM | POA: Diagnosis not present

## 2016-01-12 DIAGNOSIS — F419 Anxiety disorder, unspecified: Secondary | ICD-10-CM | POA: Diagnosis not present

## 2016-04-01 DIAGNOSIS — R42 Dizziness and giddiness: Secondary | ICD-10-CM | POA: Diagnosis not present

## 2016-04-01 DIAGNOSIS — E039 Hypothyroidism, unspecified: Secondary | ICD-10-CM | POA: Diagnosis not present

## 2016-04-01 DIAGNOSIS — I10 Essential (primary) hypertension: Secondary | ICD-10-CM | POA: Diagnosis not present

## 2016-04-01 DIAGNOSIS — N924 Excessive bleeding in the premenopausal period: Secondary | ICD-10-CM | POA: Diagnosis not present

## 2016-04-08 ENCOUNTER — Encounter: Payer: Self-pay | Admitting: Emergency Medicine

## 2016-04-08 DIAGNOSIS — R51 Headache: Secondary | ICD-10-CM | POA: Insufficient documentation

## 2016-04-08 DIAGNOSIS — F17210 Nicotine dependence, cigarettes, uncomplicated: Secondary | ICD-10-CM | POA: Insufficient documentation

## 2016-04-08 DIAGNOSIS — Z791 Long term (current) use of non-steroidal anti-inflammatories (NSAID): Secondary | ICD-10-CM | POA: Insufficient documentation

## 2016-04-08 DIAGNOSIS — E039 Hypothyroidism, unspecified: Secondary | ICD-10-CM | POA: Diagnosis not present

## 2016-04-08 DIAGNOSIS — M542 Cervicalgia: Secondary | ICD-10-CM | POA: Insufficient documentation

## 2016-04-08 DIAGNOSIS — Z79899 Other long term (current) drug therapy: Secondary | ICD-10-CM | POA: Insufficient documentation

## 2016-04-08 DIAGNOSIS — M25512 Pain in left shoulder: Secondary | ICD-10-CM | POA: Diagnosis not present

## 2016-04-08 DIAGNOSIS — R42 Dizziness and giddiness: Secondary | ICD-10-CM | POA: Diagnosis not present

## 2016-04-08 LAB — CBC
HEMATOCRIT: 38.4 % (ref 35.0–47.0)
Hemoglobin: 13.4 g/dL (ref 12.0–16.0)
MCH: 32.8 pg (ref 26.0–34.0)
MCHC: 34.8 g/dL (ref 32.0–36.0)
MCV: 94.2 fL (ref 80.0–100.0)
Platelets: 259 10*3/uL (ref 150–440)
RBC: 4.07 MIL/uL (ref 3.80–5.20)
RDW: 12.7 % (ref 11.5–14.5)
WBC: 8.9 10*3/uL (ref 3.6–11.0)

## 2016-04-08 LAB — BASIC METABOLIC PANEL
Anion gap: 7 (ref 5–15)
BUN: 16 mg/dL (ref 6–20)
CHLORIDE: 103 mmol/L (ref 101–111)
CO2: 25 mmol/L (ref 22–32)
Calcium: 9.3 mg/dL (ref 8.9–10.3)
Creatinine, Ser: 0.64 mg/dL (ref 0.44–1.00)
GFR calc Af Amer: >60 mL/min (ref >60)
GFR calc non Af Amer: >60 mL/min (ref >60)
Glucose, Bld: 164 mg/dL — ABNORMAL HIGH (ref 65–99)
POTASSIUM: 3.3 mmol/L — AB (ref 3.5–5.1)
SODIUM: 135 mmol/L (ref 135–145)

## 2016-04-08 LAB — URINALYSIS COMPLETE WITH MICROSCOPIC (ARMC ONLY)
BACTERIA UA: NONE SEEN
Bilirubin Urine: NEGATIVE
Glucose, UA: NEGATIVE mg/dL
Hgb urine dipstick: NEGATIVE
Ketones, ur: NEGATIVE mg/dL
LEUKOCYTES UA: NEGATIVE
Nitrite: NEGATIVE
PH: 6 (ref 5.0–8.0)
PROTEIN: NEGATIVE mg/dL
SQUAMOUS EPITHELIAL / LPF: NONE SEEN
Specific Gravity, Urine: 1.009 (ref 1.005–1.030)

## 2016-04-08 LAB — TROPONIN I: Troponin I: <0.03 ng/mL (ref <0.03)

## 2016-04-08 LAB — POCT PREGNANCY, URINE: PREG TEST UR: NEGATIVE

## 2016-04-08 NOTE — ED Triage Notes (Signed)
Pt ambulatory to triage with steady gait, no distress noted. Pt c/o Headache, dizziness, left shoulder and neck pain since AM. Pt denies chest pain at this time.

## 2016-04-09 ENCOUNTER — Emergency Department
Admission: EM | Admit: 2016-04-09 | Discharge: 2016-04-09 | Disposition: A | Discharge status: Home / Self Care | Payer: 59 | Attending: Emergency Medicine | Admitting: Emergency Medicine

## 2016-04-09 ENCOUNTER — Emergency Department: Payer: 59

## 2016-04-09 DIAGNOSIS — Z791 Long term (current) use of non-steroidal anti-inflammatories (NSAID): Secondary | ICD-10-CM | POA: Diagnosis not present

## 2016-04-09 DIAGNOSIS — Z79899 Other long term (current) drug therapy: Secondary | ICD-10-CM | POA: Diagnosis not present

## 2016-04-09 DIAGNOSIS — M542 Cervicalgia: Secondary | ICD-10-CM

## 2016-04-09 DIAGNOSIS — R51 Headache: Secondary | ICD-10-CM | POA: Diagnosis not present

## 2016-04-09 DIAGNOSIS — E039 Hypothyroidism, unspecified: Secondary | ICD-10-CM | POA: Diagnosis not present

## 2016-04-09 DIAGNOSIS — R42 Dizziness and giddiness: Secondary | ICD-10-CM | POA: Diagnosis not present

## 2016-04-09 DIAGNOSIS — R519 Headache, unspecified: Secondary | ICD-10-CM

## 2016-04-09 DIAGNOSIS — F17210 Nicotine dependence, cigarettes, uncomplicated: Secondary | ICD-10-CM | POA: Diagnosis not present

## 2016-04-09 DIAGNOSIS — M25512 Pain in left shoulder: Secondary | ICD-10-CM | POA: Diagnosis not present

## 2016-04-09 MED ORDER — IBUPROFEN 400 MG PO TABS
400.0000 mg | ORAL_TABLET | Freq: Once | ORAL | Status: AC
  Administered 2016-04-09: 400 mg via ORAL
  Filled 2016-04-09: qty 1

## 2016-04-09 NOTE — ED Notes (Signed)
Discharge instructions reviewed with patient. Patient verbalized understanding. Patient ambulated to lobby without difficulty.   

## 2016-04-09 NOTE — ED Provider Notes (Signed)
[facility] Emergency Department Provider Note    First MD Initiated Contact with Patient 04/09/16 0040     (approximate)  I have reviewed the triage vital signs and the nursing notes.   HISTORY  Chief Complaint Headache; Dizziness; and Shoulder Pain   HPI Tiffany Bradford is a 45 y.o. female presents with headache dizziness and left shoulder pain associated with left neck pain since this morning. Patient denies any weakness no numbness gait instability or visual changes. Patient admits to previous episodes of the same for which she's had an MRI performed   Past Medical History:  Diagnosis Date  . Anxiety   . Tension headache     Patient Active Problem List   Diagnosis Date Noted  . Ache in joint 09/28/2014  . 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 Headache 06/02/2014  . Acute onset aura migraine 05/25/2014  . Adult hypothyroidism 05/25/2014  . ANA positive 04/06/2014  . De Quervain's disease (radial styloid tenosynovitis) 04/06/2014    History reviewed. No pertinent surgical history.  Prior to Admission medications   Medication Sig Start Date End Date Taking? Authorizing Provider  clonazePAM (KLONOPIN) 0.5 MG tablet Take 0.5 mg by mouth 2 (two) times daily as needed for anxiety.    Historical Provider, MD  cyclobenzaprine (FLEXERIL) 10 MG tablet Take 10 mg by mouth at bedtime. Reported on 07/12/2015    Historical Provider, MD  gabapentin (NEURONTIN) 300 MG capsule Take 1 capsule (300 mg total) by mouth 2 (two) times daily. Patient not taking: Reported on 07/12/2015 06/04/15   Sharyn CreamerMark Quale, MD  ibuprofen (ADVIL,MOTRIN) 600 MG tablet Take 600 mg by mouth. 06/28/15   Historical Provider, MD  levothyroxine (SYNTHROID, LEVOTHROID) 88 MCG tablet  05/17/15   Historical Provider, MD  lisinopril (PRINIVIL,ZESTRIL) 10 MG tablet  06/02/15   Historical Provider, MD  potassium chloride (K-DUR) 10 MEQ tablet Take 1 tablet (10 mEq total) by mouth 2 (two)  times daily. Patient not taking: Reported on 07/12/2015 11/11/14   Darien Ramusavid W Kaminski, MD    Allergies Penicillins  History reviewed. No pertinent family history.  Social History Social History  Substance Use Topics  . Smoking status: Current Every Day Smoker    Types: Cigarettes  . Smokeless tobacco: Never Used  . Alcohol use No    Review of Systems Constitutional: No fever/chills Eyes: No visual changes. ENT: No sore throat. Cardiovascular: Denies chest pain. Respiratory: Denies shortness of breath. Gastrointestinal: No abdominal pain.  No nausea, no vomiting.  No diarrhea.  No constipation. Genitourinary: Negative for dysuria. Musculoskeletal: Negative for back pain. Skin: Negative for rash. Neurological: Positive for headache  10-point ROS otherwise negative.  ____________________________________________   PHYSICAL EXAM:  VITAL SIGNS: ED Triage Vitals  Enc Vitals Group     BP 04/08/16 2055 (!) 160/89     Pulse Rate 04/08/16 2055 92     Resp 04/08/16 2055 15     Temp 04/08/16 2055 97.8 F (36.6 C)     Temp Source 04/08/16 2055 Oral     SpO2 04/08/16 2055 97 %     Weight 04/08/16 2056 131 lb (59.4 kg)     Height 04/08/16 2056 5\' 1"  (1.549 m)     Head Circumference --      Peak Flow --      Pain Score 04/08/16 2056 7     Pain Loc --      Pain Edu? --      Excl. in GC? --  Constitutional: Alert and oriented. Well appearing and in no acute distress. Eyes: Conjunctivae are normal. PERRL. EOMI. Head: Atraumatic. Ears:  Healthy appearing ear canals and TMs bilaterally Nose: No congestion/rhinnorhea. Mouth/Throat: Mucous membranes are moist.  Oropharynx non-erythematous. Neck: No stridor.  No meningeal signs.  No cervical spine tenderness to palpation. Cardiovascular: Normal rate, regular rhythm. Good peripheral circulation. Grossly normal heart sounds. Respiratory: Normal respiratory effort.  No retractions. Lungs CTAB. Gastrointestinal: Soft and  nontender. No distention.  Musculoskeletal: No lower extremity tenderness nor edema. No gross deformities of extremities. Neurologic:  Normal speech and language. No gross focal neurologic deficits are appreciated.  Skin:  Skin is warm, dry and intact. No rash noted. Psychiatric: Mood and affect are normal. Speech and behavior are normal.  ____________________________________________   LABS (all labs ordered are listed, but only abnormal results are displayed)  Labs Reviewed  BASIC METABOLIC PANEL - Abnormal; Notable for the following:       Result Value   Potassium 3.3 (*)    Glucose, Bld 164 (*)    All other components within normal limits  URINALYSIS COMPLETEWITH MICROSCOPIC ([facility] ONLY) - Abnormal; Notable for the following:    Color, Urine STRAW (*)    APPearance CLEAR (*)    All other components within normal limits  CBC  TROPONIN I  POC URINE PREG, ED  POCT PREGNANCY, URINE    RADIOLOGY I, Welsh N Naliyah Neth, personally viewed and evaluated these images (plain radiographs) as part of my medical decision making, as well as reviewing the written report by the radiologist.  CLINICAL DATA:  45 year old female with headache and dizziness and left-sided neck pain.  EXAM: CT HEAD WITHOUT CONTRAST  CT CERVICAL SPINE WITHOUT CONTRAST  TECHNIQUE: Multidetector CT imaging of the head and cervical spine was performed following the standard protocol without intravenous contrast. Multiplanar CT image reconstructions of the cervical spine were also generated.  COMPARISON:  Brain MRI dated 06/04/2015  FINDINGS: CT HEAD FINDINGS  Brain: No evidence of acute infarction, hemorrhage, hydrocephalus, extra-axial collection or mass lesion/mass effect.  Vascular: No hyperdense vessel or unexpected calcification.  Skull: Normal. Negative for fracture or focal lesion.  Sinuses/Orbits: No acute finding.  Other: None  CT CERVICAL SPINE FINDINGS  Alignment:  Normal.  Skull base and vertebrae: No acute fracture. No primary bone lesion or focal pathologic process.  Soft tissues and spinal canal: No prevertebral fluid or swelling. No visible canal hematoma.  Disc levels:  No acute findings  Upper chest: Negative.  Other: None  IMPRESSION: No acute intracranial pathology.  No acute/ traumatic cervical spine pathology.   Electronically Signed   By: Elgie Collard M.D.   On: 04/09/2016 02:20  No results found.  __________________ Procedures     INITIAL IMPRESSION / ASSESSMENT AND PLAN / ED COURSE  Pertinent labs & imaging results that were available during my care of the patient were reviewed by me and considered in my medical decision making (see chart for details).     Clinical Course    ____________________________________________  FINAL CLINICAL IMPRESSION(S) / ED DIAGNOSES  Final diagnoses:  Acute nonintractable headache, unspecified headache type  Neck pain     MEDICATIONS GIVEN DURING THIS VISIT:  Medications  ibuprofen (ADVIL,MOTRIN) tablet 400 mg (400 mg Oral Given 04/09/16 0055)     NEW OUTPATIENT MEDICATIONS STARTED DURING THIS VISIT:  Discharge Medication List as of 04/09/2016  2:37 AM      Discharge Medication List as of 04/09/2016  2:37 AM  Discharge Medication List as of 04/09/2016  2:37 AM       Note:  This document was prepared using Dragon voice recognition software and may include unintentional dictation errors.    Darci Current, MD 04/12/16 [phone]

## 2016-04-09 NOTE — ED Notes (Signed)
MD at bedside. 

## 2016-04-19 DIAGNOSIS — R42 Dizziness and giddiness: Secondary | ICD-10-CM | POA: Diagnosis not present

## 2016-04-19 DIAGNOSIS — Z130 Encounter for screening for diseases of the blood and blood-forming organs and certain disorders involving the immune mechanism: Secondary | ICD-10-CM | POA: Diagnosis not present

## 2016-04-19 DIAGNOSIS — Z1329 Encounter for screening for other suspected endocrine disorder: Secondary | ICD-10-CM | POA: Diagnosis not present

## 2016-05-24 ENCOUNTER — Encounter: Payer: Self-pay | Admitting: Physician Assistant

## 2016-05-24 ENCOUNTER — Ambulatory Visit: Payer: Self-pay | Admitting: Physician Assistant

## 2016-05-24 VITALS — BP 128/74 | HR 80 | Temp 97.5°F

## 2016-05-24 DIAGNOSIS — M436 Torticollis: Secondary | ICD-10-CM

## 2016-05-24 MED ORDER — CYCLOBENZAPRINE HCL 10 MG PO TABS: 10.0000 mg | ORAL_TABLET | Freq: Every day | ORAL | 0 refills | Status: AC

## 2016-05-24 NOTE — Progress Notes (Signed)
S: c/o pain in neck and back of head, increased pain with turning her head and bending her neck, no fever/chills, no cp/sob, no known injury, states she is not taking her lexapro every day, just taking it when she feels anxious  O: vitals wnl, nad, spine nontender, shoulders spasmed b/l, pain reproduced with turning neck, grips = b/l, n/v intact  A: torticollis  P: flexeril, take lexapro daily, explained in detail why the medication needs to be taken every day

## 2016-05-29 DIAGNOSIS — F419 Anxiety disorder, unspecified: Secondary | ICD-10-CM | POA: Diagnosis not present

## 2016-05-29 DIAGNOSIS — G44229 Chronic tension-type headache, not intractable: Secondary | ICD-10-CM | POA: Diagnosis not present

## 2016-09-06 DIAGNOSIS — Z0000 Encounter for general adult medical examination without abnormal findings: Secondary | ICD-10-CM | POA: Diagnosis not present

## 2016-09-06 DIAGNOSIS — Z1389 Encounter for screening for other disorder: Secondary | ICD-10-CM | POA: Diagnosis not present

## 2016-09-06 DIAGNOSIS — R002 Palpitations: Secondary | ICD-10-CM | POA: Diagnosis not present

## 2016-09-06 DIAGNOSIS — D649 Anemia, unspecified: Secondary | ICD-10-CM | POA: Diagnosis not present

## 2016-09-06 DIAGNOSIS — R87611 Atypical squamous cells cannot exclude high grade squamous intraepithelial lesion on cytologic smear of cervix (ASC-H): Secondary | ICD-10-CM | POA: Diagnosis not present

## 2016-09-06 DIAGNOSIS — I10 Essential (primary) hypertension: Secondary | ICD-10-CM | POA: Diagnosis not present

## 2016-09-06 DIAGNOSIS — E039 Hypothyroidism, unspecified: Secondary | ICD-10-CM | POA: Diagnosis not present

## 2016-09-18 ENCOUNTER — Telehealth: Payer: Self-pay | Admitting: Obstetrics & Gynecology

## 2016-09-18 NOTE — Telephone Encounter (Signed)
Tiffany Bradford is Referring Pt for an Colpo, Pap ascus cannot exclude HSIL. Called and lvm for pt to call back to be schedule.

## 2016-09-19 NOTE — Telephone Encounter (Signed)
Pt is schedule 10/17/16 with Jean RosenthalJackson

## 2016-10-17 ENCOUNTER — Ambulatory Visit (INDEPENDENT_AMBULATORY_CARE_PROVIDER_SITE_OTHER): Payer: 59 | Admitting: Obstetrics and Gynecology

## 2016-10-17 ENCOUNTER — Encounter: Payer: Self-pay | Admitting: Obstetrics and Gynecology

## 2016-10-17 VITALS — BP 118/74 | Ht 61.0 in | Wt 136.0 lb

## 2016-10-17 DIAGNOSIS — N870 Mild cervical dysplasia: Secondary | ICD-10-CM | POA: Diagnosis not present

## 2016-10-17 DIAGNOSIS — R87611 Atypical squamous cells cannot exclude high grade squamous intraepithelial lesion on cytologic smear of cervix (ASC-H): Secondary | ICD-10-CM

## 2016-10-17 DIAGNOSIS — N871 Moderate cervical dysplasia: Secondary | ICD-10-CM | POA: Diagnosis not present

## 2016-10-17 NOTE — Progress Notes (Signed)
Referring Provider:  [facility]  HPI:  Tiffany Bradford is a 46 y.o.  [id number]  who presents today for evaluation and management of abnormal cervical cytology.    Dysplasia History:  ASC-H pap smear, prior to that she states she has only had normal pap smears  Review of Systems  Constitutional: Negative.   HENT: Negative.   Eyes: Negative.   Respiratory: Negative.   Cardiovascular: Negative.   Gastrointestinal: Negative.   Genitourinary: Negative.   Musculoskeletal: Negative.   Skin: Negative.   Neurological: Negative.   Psychiatric/Behavioral: Negative.      OB History  Gravida Para Term Preterm AB Living  SAB TAB Ectopic Multiple Live Births  2       3    # Outcome Date GA Lbr Len/2nd Weight Sex Delivery Anes PTL Lv  5 SAB           4 SAB           3 Term     M Vag-Spont  N LIV  2 Term     M Vag-Spont  N LIV  1 Term     F Vag-Spont  N LIV      Past Medical History:  Diagnosis Date  . Abnormal Pap smear of cervix   . Anxiety   . Gestational diabetes   . Hypertension   . Hypothyroidism   . Pre-eclampsia   . Tension headache   . TMJ (dislocation of temporomandibular joint)    History reviewed. No pertinent surgical history.  SOCIAL HISTORY:  History  Alcohol Use No    History  Drug Use No     History reviewed. No pertinent family history.  ALLERGIES:  Penicillins  Current Outpatient Prescriptions on File Prior to Visit  Medication Sig Dispense Refill  . clonazePAM (KLONOPIN) 0.5 MG tablet Take 0.5 mg by mouth 2 (two) times daily as needed for anxiety.    Marland Kitchen levothyroxine (SYNTHROID, LEVOTHROID) 88 MCG tablet   3  . lisinopril (PRINIVIL,ZESTRIL) 10 MG tablet   11  . cyclobenzaprine (FLEXERIL) 10 MG tablet Take 1 tablet (10 mg total) by mouth at bedtime. Reported on 07/12/2015 (Patient not taking: Reported on 10/17/2016) 30 tablet 0  . escitalopram (LEXAPRO) 5 MG tablet Take 5 mg by mouth.    . gabapentin (NEURONTIN) 300  MG capsule Take 1 capsule (300 mg total) by mouth 2 (two) times daily. (Patient not taking: Reported on 05/24/2016) 28 capsule 0  . ibuprofen (ADVIL,MOTRIN) 600 MG tablet Take 600 mg by mouth.    . potassium chloride (K-DUR) 10 MEQ tablet Take 1 tablet (10 mEq total) by mouth 2 (two) times daily. (Patient not taking: Reported on 05/24/2016) 20 tablet 0   No current facility-administered medications on file prior to visit.     Physical Exam: -Vitals:  BP 118/74   Ht  (1.549 m)   Wt 136 lb (61.7 kg)   BMI 25.70 kg/m  GEN: WD, WN, NAD.  A+ O x 3, good mood and affect. ABD:  NT, ND.  Soft, no masses.  No hernias noted.   Pelvic:   Vulva: Normal appearance.  No lesions.  Vagina: No lesions or abnormalities noted.  Support: Normal pelvic support.  Urethra No masses tenderness or scarring.  Meatus Normal size without lesions or prolapse.  Cervix: See below.  Anus: Normal exam.  No lesions.  Perineum: Normal exam.  No lesions.  Bimanual   Uterus: Normal size.  Non-tender.  Mobile.  AV.  Adnexae: No masses.  Non-tender to palpation.  Cul-de-sac: Negative for abnormality.   PROCEDURE: 1.  Urine Pregnancy Test:  negative 2.  Colposcopy performed with 4% acetic acid after verbal consent obtained                                         -Aceto-white Lesions Location(s): diffusely.              -Biopsy performed at 2, 4, 8, and 10 o'clock               -ECC indicated and performed: Yes.       -Biopsy sites made hemostatic with pressure, AgNO3, and/or Monsel's solution   -Satisfactory colposcopy: No.    -Evidence of Invasive cervical CA :  NO   -Cervix notably nearly flush with left vaginal sidewall  ASSESSMENT:  Tiffany Bradford is a 46 y.o. [phone] here for  1. Atypical squamous cells cannot exclude high grade squamous intraepithelial lesion on cytologic smear of cervix (ASC-H)   .  PLAN: I discussed the grading system of pap smears and HPV high risk viral types.  We  will discuss and base management after colpo results return.      Thomasene Mohair, MD  [facility], [facility] 10/18/2016  11:53 AM   CC: [facility]

## 2016-10-23 LAB — PATHOLOGY

## 2016-10-30 ENCOUNTER — Ambulatory Visit: Payer: Self-pay | Admitting: Physician Assistant

## 2016-10-30 VITALS — BP 110/80 | HR 74 | Temp 98.3°F

## 2016-10-30 DIAGNOSIS — N390 Urinary tract infection, site not specified: Secondary | ICD-10-CM

## 2016-10-30 DIAGNOSIS — R509 Fever, unspecified: Secondary | ICD-10-CM

## 2016-10-30 DIAGNOSIS — R300 Dysuria: Secondary | ICD-10-CM

## 2016-10-30 LAB — POCT URINALYSIS DIPSTICK
BILIRUBIN UA: NEGATIVE
Glucose, UA: NEGATIVE
Ketones, UA: NEGATIVE
NITRITE UA: NEGATIVE
PH UA: 6 (ref 5.0–8.0)
Protein, UA: NEGATIVE
RBC UA: NEGATIVE
Spec Grav, UA: 1.02 (ref 1.010–1.025)
Urobilinogen, UA: 0.2 E.U./dL

## 2016-10-30 LAB — POCT RAPID STREP A (OFFICE): Rapid Strep A Screen: NEGATIVE

## 2016-10-30 LAB — POCT INFLUENZA A/B
INFLUENZA B, POC: NEGATIVE
Influenza A, POC: NEGATIVE

## 2016-10-30 MED ORDER — CIPROFLOXACIN HCL 250 MG PO TABS: 250.0000 mg | ORAL_TABLET | Freq: Two times a day (BID) | ORAL | 0 refills | Status: DC

## 2016-10-30 NOTE — Progress Notes (Signed)
S: C/o sore throat and headache  for 1 day, + fever, chills, and body aches; also some uti sx this morning, burning with urination, denies cp/sob/ v/d; hurts to swallow, son has v/d,  Using otc meds: none  O: PE: vitals w low grade temp, nad,  perrl eomi, normocephalic, tms dull, nasal mucosa red and swollen, throat injected, neck supple no lymph, lungs c t a, cv rrr, neuro intact, flu swab neg, q strep neg, ua 1+ leuks  A:  Acute uti   P: drink fluids, continue regular meds , use otc meds of choice, return if not improving in 5 days, return earlier if worsening, cipro 500mg  bid, tylenol or motrin for fever

## 2016-11-01 ENCOUNTER — Telehealth: Payer: Self-pay | Admitting: Obstetrics and Gynecology

## 2016-11-01 NOTE — Telephone Encounter (Signed)
Pt still waiting for phone call with results from colpo. Please call pt asap.  Thank you

## 2016-11-02 DIAGNOSIS — A084 Viral intestinal infection, unspecified: Secondary | ICD-10-CM | POA: Diagnosis not present

## 2016-11-02 DIAGNOSIS — Z1389 Encounter for screening for other disorder: Secondary | ICD-10-CM | POA: Diagnosis not present

## 2016-11-02 DIAGNOSIS — Z3200 Encounter for pregnancy test, result unknown: Secondary | ICD-10-CM | POA: Diagnosis not present

## 2016-11-06 ENCOUNTER — Telehealth: Payer: Self-pay | Admitting: Obstetrics and Gynecology

## 2016-11-06 NOTE — Telephone Encounter (Signed)
Message left by spanish-language interpreter (interpreter # [phone]) to have patient call [facility] to review results.

## 2016-11-11 NOTE — Telephone Encounter (Signed)
Pt still has not called back. Do you want to try to call again since its been about a week?

## 2016-11-19 NOTE — Telephone Encounter (Signed)
Pt came by again about results. She does not need the interpreter to call her. Please call her with results as soon as possible.

## 2016-11-28 ENCOUNTER — Telehealth: Payer: Self-pay | Admitting: Obstetrics and Gynecology

## 2016-11-28 DIAGNOSIS — N871 Moderate cervical dysplasia: Secondary | ICD-10-CM | POA: Insufficient documentation

## 2016-11-28 NOTE — Telephone Encounter (Signed)
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 Recommend LEEP procedure, which I will arrange for her.  She was able to understand, in [place], my explanation. I offered to give her the results in Spanish, as I am a certified interpreter at [facility].  She declined this. All questions she asked were appropriate to the conversation and indicated understanding.  I made sure she understood that without treatment, this type of change (CIN 2) could lead to cancer. However, with LEEP she should expect a cure in about 85-90% of cases.  Will arrange LEEP in-office for as soon as is feasible for her and my schedule.  Surgery request sent just now.

## 2016-11-29 ENCOUNTER — Telehealth: Payer: Self-pay | Admitting: Obstetrics and Gynecology

## 2016-11-29 NOTE — Telephone Encounter (Signed)
I contacted the patient via [address], [place], BM#[id number]. Patient is aware of LEEP on Wed, 12/11/16 @ 1:30pm with Dr Jean RosenthalJackson.

## 2016-12-03 NOTE — Telephone Encounter (Signed)
Noted per Staff Message received yesterday. Supplies ordered today to arrive by apt date/time.

## 2016-12-09 NOTE — Telephone Encounter (Signed)
Supplies arrived 12/06/16.

## 2016-12-11 ENCOUNTER — Encounter: Payer: Self-pay | Admitting: Obstetrics and Gynecology

## 2016-12-11 ENCOUNTER — Ambulatory Visit (INDEPENDENT_AMBULATORY_CARE_PROVIDER_SITE_OTHER): Payer: 59 | Admitting: Obstetrics and Gynecology

## 2016-12-11 VITALS — BP 114/76 | Ht 60.0 in | Wt 137.0 lb

## 2016-12-11 DIAGNOSIS — N871 Moderate cervical dysplasia: Secondary | ICD-10-CM | POA: Diagnosis not present

## 2016-12-11 MED ORDER — HYDROCODONE-ACETAMINOPHEN 5-325 MG PO TABS: 1.0000 | ORAL_TABLET | Freq: Four times a day (QID) | ORAL | 0 refills | Status: AC | PRN

## 2016-12-11 MED ORDER — IBUPROFEN 600 MG PO TABS: 600.0000 mg | ORAL_TABLET | Freq: Four times a day (QID) | ORAL | 0 refills | Status: AC | PRN

## 2016-12-11 NOTE — Progress Notes (Signed)
  LEEP PROCEDURE NOTE  The LEEP has been explained to the patient in detail; risks/benefits reviewed.  The risks include, but are not limited to, bleeding, infection, and the possibility of cervical stenosis or cervical incompetence.  The patient had previously been given information regarding abnormal PAP smears and their relationship to HPV.  We have discussed the natural course and history of HPV, the possibility of incomplete treatment by the LEEP, as well as the possibility of recurrence.  I have reviewed the consent form for LEEP with her, and she fully understands its contents.  We have discussed the procedure itself. I have informed her that following the LEEP she should refrain from intercourse and the use of tampons for three weeks, and that she should also expect some spotting and brown/black discharge over the next several days.  We have discussed the fact that vaginal bleeding, differentiated from spotting, is not normal and that if she should have this complication, she should contact me immediately.  The follow-up after LEEP will be PAP smears or viral typing performed at regular intervals for up to 3-5 years.  Should these all prove to be normal, she will then be back on typical cervical screening.  I have answered all of her questions, and I believe she has an adequate understanding of the LEEP, its implications, and the necessity of follow-up care.  I discussed her colpo results and explained the procedure of LEEP.  All questions were answered and she signed the consent form.    LEEP performed in the usual manner after reviewing the previous colpo findings and results. Acetic acid solution and Lugol's solution was usesd to identify any abnormal areas of the cervix.  The cervix was cleansed with betadine solution. Local injection of Lidocaine was performed for anesthesia. Ectocervical and then endocervical specimens obtained using the loop electrodes without difficulty.  It was labeled  accordingly. ECC performed.  The base and edges of the defect was then cauterized using coagulation current.  Monsel's solution applied for continued hemostasis.   The patient tolerated the procedure well.   Thomasene MohairStephen Dana Dorner, MD 12/11/2016 5:37 PM

## 2016-12-13 LAB — PATHOLOGY

## 2016-12-30 DIAGNOSIS — R51 Headache: Secondary | ICD-10-CM | POA: Diagnosis not present

## 2016-12-30 DIAGNOSIS — M329 Systemic lupus erythematosus, unspecified: Secondary | ICD-10-CM | POA: Diagnosis not present

## 2016-12-30 DIAGNOSIS — E039 Hypothyroidism, unspecified: Secondary | ICD-10-CM | POA: Diagnosis not present

## 2017-01-03 DIAGNOSIS — F419 Anxiety disorder, unspecified: Secondary | ICD-10-CM | POA: Diagnosis not present

## 2017-01-03 DIAGNOSIS — G44229 Chronic tension-type headache, not intractable: Secondary | ICD-10-CM | POA: Diagnosis not present

## 2017-01-06 ENCOUNTER — Encounter: Payer: Self-pay | Admitting: Obstetrics and Gynecology

## 2017-01-06 ENCOUNTER — Ambulatory Visit (INDEPENDENT_AMBULATORY_CARE_PROVIDER_SITE_OTHER): Payer: 59 | Admitting: Obstetrics and Gynecology

## 2017-01-06 VITALS — BP 122/64 | HR 74 | Ht 61.0 in | Wt 139.0 lb

## 2017-01-06 DIAGNOSIS — N871 Moderate cervical dysplasia: Secondary | ICD-10-CM

## 2017-01-06 NOTE — Progress Notes (Signed)
   Postoperative Follow-up Patient presents post op from LEEP 4weeks ago for CIN 2.  Subjective: Eating a regular diet without difficulty. The patient is not having any pain.  Activity: normal activities of daily living. Denies fevers, chills, abnormal vaginal bleeding.  Objective: Vitals:   01/06/17 1456  BP: 122/64  Pulse: 74   Vital Signs: BP 122/64   Pulse 74   Ht 5\' 1"  (1.549 m)   Wt 139 lb (63 kg)   LMP  (LMP Unknown)   BMI 26.26 kg/m  Constitutional: Well nourished, well developed female in no acute distress.  HEENT: normal Skin: Warm and dry.  Extremity: no edena  Abdomen: soft, nontender n/a  Pelvic exam: (female chaperone present) is not limited by body habitus EGBUS: within normal limits Vagina: within normal limits and with no blood in the vault Cervix: LEEP bed well-healed. No active bleeding. Os is patent.     Assessment: 46 y.o. s/p LEEP progressing well  Plan: Patient has done well after surgery with no apparent complications.  I have discussed the post-operative course to date, and the expected progress moving forward.  The patient understands what complications to be concerned about.  I will see the patient in routine follow up, or sooner if needed.    Activity plan: No restriction.  Thomasene MohairStephen Angelica Wix, MD 01/06/2017, 3:06 PM  CC: [facility]

## 2017-01-10 ENCOUNTER — Ambulatory Visit: Payer: 59 | Admitting: Obstetrics and Gynecology

## 2017-01-10 DIAGNOSIS — R51 Headache: Secondary | ICD-10-CM | POA: Diagnosis not present

## 2017-03-11 DIAGNOSIS — G44229 Chronic tension-type headache, not intractable: Secondary | ICD-10-CM | POA: Diagnosis not present

## 2017-04-22 DIAGNOSIS — R002 Palpitations: Secondary | ICD-10-CM | POA: Diagnosis not present

## 2017-04-22 DIAGNOSIS — R0789 Other chest pain: Secondary | ICD-10-CM | POA: Diagnosis not present

## 2017-04-22 DIAGNOSIS — E039 Hypothyroidism, unspecified: Secondary | ICD-10-CM | POA: Diagnosis not present

## 2017-04-22 DIAGNOSIS — I10 Essential (primary) hypertension: Secondary | ICD-10-CM | POA: Diagnosis not present

## 2017-04-22 DIAGNOSIS — F411 Generalized anxiety disorder: Secondary | ICD-10-CM | POA: Diagnosis not present

## 2017-05-08 DIAGNOSIS — F411 Generalized anxiety disorder: Secondary | ICD-10-CM | POA: Diagnosis not present

## 2017-05-08 DIAGNOSIS — Z3200 Encounter for pregnancy test, result unknown: Secondary | ICD-10-CM | POA: Diagnosis not present

## 2017-06-13 DIAGNOSIS — N951 Menopausal and female climacteric states: Secondary | ICD-10-CM | POA: Diagnosis not present

## 2017-06-13 DIAGNOSIS — R87610 Atypical squamous cells of undetermined significance on cytologic smear of cervix (ASC-US): Secondary | ICD-10-CM | POA: Diagnosis not present

## 2017-07-14 ENCOUNTER — Telehealth: Payer: Self-pay | Admitting: Obstetrics and Gynecology

## 2017-07-14 NOTE — Telephone Encounter (Signed)
Left voicemail for patient to call back to schedule repeat Papsmear with [facility]. [facility]. Per [facility] schedule appt for around (12/10/17.

## 2017-08-22 DIAGNOSIS — Z3200 Encounter for pregnancy test, result unknown: Secondary | ICD-10-CM | POA: Diagnosis not present

## 2017-08-22 DIAGNOSIS — R1084 Generalized abdominal pain: Secondary | ICD-10-CM | POA: Diagnosis not present

## 2017-08-22 DIAGNOSIS — Z1389 Encounter for screening for other disorder: Secondary | ICD-10-CM | POA: Diagnosis not present

## 2017-09-05 DIAGNOSIS — R1084 Generalized abdominal pain: Secondary | ICD-10-CM | POA: Diagnosis not present

## 2017-09-08 ENCOUNTER — Other Ambulatory Visit: Payer: Self-pay | Admitting: Family Medicine

## 2017-09-08 DIAGNOSIS — R1011 Right upper quadrant pain: Secondary | ICD-10-CM

## 2017-09-08 DIAGNOSIS — R945 Abnormal results of liver function studies: Principal | ICD-10-CM

## 2017-09-08 DIAGNOSIS — R7989 Other specified abnormal findings of blood chemistry: Secondary | ICD-10-CM

## 2017-09-16 ENCOUNTER — Ambulatory Visit: Admission: RE | Admit: 2017-09-16 | Payer: 59 | Source: Ambulatory Visit

## 2017-09-17 ENCOUNTER — Ambulatory Visit
Admission: RE | Admit: 2017-09-17 | Discharge: 2017-09-17 | Disposition: A | Discharge status: Home / Self Care | Payer: 59 | Source: Ambulatory Visit | Attending: Family Medicine | Admitting: Family Medicine

## 2017-09-17 DIAGNOSIS — R945 Abnormal results of liver function studies: Secondary | ICD-10-CM

## 2017-09-17 DIAGNOSIS — K8020 Calculus of gallbladder without cholecystitis without obstruction: Secondary | ICD-10-CM | POA: Diagnosis not present

## 2017-09-17 DIAGNOSIS — R7989 Other specified abnormal findings of blood chemistry: Secondary | ICD-10-CM

## 2017-09-17 DIAGNOSIS — R1011 Right upper quadrant pain: Secondary | ICD-10-CM

## 2017-10-09 DIAGNOSIS — K219 Gastro-esophageal reflux disease without esophagitis: Secondary | ICD-10-CM | POA: Diagnosis not present

## 2017-10-09 DIAGNOSIS — Z1389 Encounter for screening for other disorder: Secondary | ICD-10-CM | POA: Diagnosis not present

## 2017-10-09 DIAGNOSIS — N390 Urinary tract infection, site not specified: Secondary | ICD-10-CM | POA: Diagnosis not present

## 2017-10-16 DIAGNOSIS — N390 Urinary tract infection, site not specified: Secondary | ICD-10-CM | POA: Diagnosis not present

## 2017-12-10 ENCOUNTER — Encounter: Payer: Self-pay | Admitting: Obstetrics and Gynecology

## 2017-12-10 ENCOUNTER — Ambulatory Visit (INDEPENDENT_AMBULATORY_CARE_PROVIDER_SITE_OTHER): Payer: 59 | Admitting: Obstetrics and Gynecology

## 2017-12-10 VITALS — BP 110/64 | HR 61 | Ht 61.0 in | Wt 133.0 lb

## 2017-12-10 DIAGNOSIS — N871 Moderate cervical dysplasia: Secondary | ICD-10-CM

## 2017-12-10 DIAGNOSIS — Z124 Encounter for screening for malignant neoplasm of cervix: Secondary | ICD-10-CM

## 2017-12-10 LAB — HM PAP SMEAR

## 2017-12-10 NOTE — Progress Notes (Signed)
Obstetrics & Gynecology Office Visit   Chief Complaint  Patient presents with  . Follow-up    Repeat Pap    History of Present Illness: 47 y.o. [id number] female who presents for a follow-up pap smear. She underwent a LEEP one year ago for CIN II with negative margins.  Denies abnormal uterine bleeding. Denies any new medical changes.     Past Medical History:  Diagnosis Date  . Abnormal Pap smear of cervix   . Anxiety   . Gestational diabetes   . Hypertension   . Hypothyroidism   . Pre-eclampsia   . Tension headache   . TMJ (dislocation of temporomandibular joint)     History reviewed. No pertinent surgical history.  Gynecologic History: No LMP recorded. (Menstrual status: Irregular Periods).  Obstetric History: [id number]  History reviewed. No pertinent family history.  Social History   Socioeconomic History  . Marital status: Single    Spouse name: Not on file  . Number of children: Not on file  . Years of education: Not on file  . Highest education level: Not on file  Occupational History  . Not on file  Social Needs  . Financial resource strain: Not on file  . Food insecurity:    Worry: Not on file    Inability: Not on file  . Transportation needs:    Medical: Not on file    Non-medical: Not on file  Tobacco Use  . Smoking status: Never Smoker  . Smokeless tobacco: Never Used  Substance and Sexual Activity  . Alcohol use: No  . Drug use: No  . Sexual activity: Not Currently    Birth control/protection: None  Lifestyle  . Physical activity:    Days per week: Not on file    Minutes per session: Not on file  . Stress: Not on file  Relationships  . Social connections:    Talks on phone: Not on file    Gets together: Not on file    Attends religious service: Not on file    Active member of club or organization: Not on file    Attends meetings of clubs or organizations: Not on file    Relationship status: Not on file  . Intimate partner violence:   Fear of current or ex partner: Not on file    Emotionally abused: Not on file    Physically abused: Not on file    Forced sexual activity: Not on file  Other Topics Concern  . Not on file  Social History Narrative  . Not on file    Allergies  Allergen Reactions  . Penicillins Shortness Of Breath    Prior to Admission medications   Medication Sig Start Date End Date Taking? Authorizing Provider  clonazePAM (KLONOPIN) 0.5 MG tablet Take 0.5 mg by mouth 2 (two) times daily as needed for anxiety.   Yes [provider]  levothyroxine (SYNTHROID, LEVOTHROID) 88 MCG tablet  05/17/15  Yes [provider]  lisinopril (PRINIVIL,ZESTRIL) 10 MG tablet  06/02/15  Yes [provider]  cyclobenzaprine (FLEXERIL) 10 MG tablet Take 1 tablet (10 mg total) by mouth at bedtime. Reported on 07/12/2015 Patient not taking: Reported on 10/17/2016 05/24/16   Faythe GheeFisher, Susan W, PA-C  escitalopram (LEXAPRO) 5 MG tablet Take 5 mg by mouth. 12/29/15 12/28/16  [provider]  escitalopram (LEXAPRO) 5 MG tablet  01/03/17   [provider]  gabapentin (NEURONTIN) 300 MG capsule Take 1 capsule (300 mg total) by mouth 2 (two) times  daily. Patient not taking: Reported on 12/10/2017 06/04/15   Sharyn Creamer, MD  HYDROcodone-acetaminophen (NORCO) 5-325 MG tablet Take 1 tablet by mouth every 6 (six) hours as needed (breakthrough pain). Patient not taking: Reported on 01/06/2017 12/11/16   Conard Novak, MD  ibuprofen (ADVIL,MOTRIN) 600 MG tablet Take 1 tablet (600 mg total) by mouth every 6 (six) hours as needed for mild pain or cramping. Patient not taking: Reported on 01/06/2017 12/11/16   Conard Novak, MD  potassium chloride (K-DUR) 10 MEQ tablet Take 1 tablet (10 mEq total) by mouth 2 (two) times daily. Patient not taking: Reported on 12/10/2017 11/11/14   Darien Ramus, MD  tiZANidine (ZANAFLEX) 4 MG tablet Take 3 pills at night 01/03/17   [provider]    Review  of Systems  Constitutional: Negative.   HENT: Negative.   Eyes: Negative.   Respiratory: Negative.   Cardiovascular: Negative.   Gastrointestinal: Negative.   Genitourinary: Negative.   Musculoskeletal: Negative.   Skin: Negative.   Neurological: Negative.   Psychiatric/Behavioral: Negative.      Physical Exam BP 110/64 (BP Location: Left Arm, Patient Position: Sitting, Cuff Size: Normal)   Pulse 61   Ht 5\' 1"  (1.549 m)   Wt 133 lb (60.3 kg)   SpO2 99%   BMI 25.13 kg/m  No LMP recorded. (Menstrual status: Irregular Periods). Physical Exam  Constitutional: She is oriented to person, place, and time. She appears well-developed and well-nourished. No distress.  Genitourinary: Vagina normal and uterus normal. Pelvic exam was performed with patient supine. There is no rash, tenderness or lesion on the right labia. There is no rash, tenderness or lesion on the left labia. Right adnexum does not display mass, does not display tenderness and does not display fullness. Left adnexum does not display mass, does not display tenderness and does not display fullness. Cervix does not exhibit motion tenderness, lesion or polyp.   Uterus is mobile. Uterus is not enlarged, tender, exhibiting a mass or irregular (is regular).  HENT:  Head: Normocephalic and atraumatic.  Eyes: Conjunctivae are normal. No scleral icterus.  Neck: No thyromegaly present.  Pulmonary/Chest: Effort normal. No respiratory distress.  Abdominal: Soft. She exhibits no distension and no mass. There is no tenderness. There is no rebound and no guarding.  Musculoskeletal: Normal range of motion. She exhibits no edema.  Neurological: She is alert and oriented to person, place, and time. No cranial nerve deficit.  Skin: Skin is dry. No erythema.  Psychiatric: She has a normal mood and affect. Her behavior is normal. Judgment normal.    Female chaperone present for pelvic and breast  portions of the physical exam  Assessment:  47 y.o. [id number] female here for  1. CIN II (cervical intraepithelial neoplasia II)      Plan: Problem List Items Addressed This Visit      Genitourinary   CIN II (cervical intraepithelial neoplasia II) - Primary   Relevant Orders   IGP, Aptima HPV     Repeat pap smear today in follow up of LEEP for CIN II one year ago.  Will follow up based on results.  Thomasene Mohair, MD 12/10/2017 9:56 AM

## 2017-12-17 LAB — IGP, APTIMA HPV
HPV APTIMA: POSITIVE — AB
PAP Smear Comment: 0

## 2017-12-23 ENCOUNTER — Encounter: Payer: Self-pay | Admitting: Obstetrics and Gynecology

## 2017-12-23 ENCOUNTER — Telehealth: Payer: Self-pay | Admitting: Obstetrics and Gynecology

## 2017-12-23 NOTE — Telephone Encounter (Signed)
Discussed negative cytology with +HPV. Discussed recommendation for colposcopy. She voiced understanding and will call the office to schedule an appointment soon. All questions answered.   Thomasene MohairStephen Burk Hoctor, MD, Merlinda FrederickFACOG [facility], [facility] 12/23/2017 2:15 PM

## 2018-01-06 DIAGNOSIS — R51 Headache: Secondary | ICD-10-CM | POA: Diagnosis not present

## 2018-01-06 DIAGNOSIS — Z880 Allergy status to penicillin: Secondary | ICD-10-CM | POA: Diagnosis not present

## 2018-01-06 DIAGNOSIS — I10 Essential (primary) hypertension: Secondary | ICD-10-CM | POA: Diagnosis not present

## 2018-01-06 DIAGNOSIS — E039 Hypothyroidism, unspecified: Secondary | ICD-10-CM | POA: Diagnosis not present

## 2018-01-06 DIAGNOSIS — Z79899 Other long term (current) drug therapy: Secondary | ICD-10-CM | POA: Diagnosis not present

## 2018-01-07 DIAGNOSIS — I10 Essential (primary) hypertension: Secondary | ICD-10-CM | POA: Diagnosis not present

## 2018-01-07 DIAGNOSIS — Z79899 Other long term (current) drug therapy: Secondary | ICD-10-CM | POA: Diagnosis not present

## 2018-01-07 DIAGNOSIS — Z880 Allergy status to penicillin: Secondary | ICD-10-CM | POA: Diagnosis not present

## 2018-01-07 DIAGNOSIS — E039 Hypothyroidism, unspecified: Secondary | ICD-10-CM | POA: Diagnosis not present

## 2018-01-07 DIAGNOSIS — R51 Headache: Secondary | ICD-10-CM | POA: Diagnosis not present

## 2018-02-04 DIAGNOSIS — G44229 Chronic tension-type headache, not intractable: Secondary | ICD-10-CM | POA: Diagnosis not present

## 2018-02-27 ENCOUNTER — Telehealth: Payer: Self-pay | Admitting: Obstetrics and Gynecology

## 2018-02-27 ENCOUNTER — Ambulatory Visit: Payer: 59 | Admitting: Obstetrics and Gynecology

## 2018-02-27 NOTE — Telephone Encounter (Signed)
-----   Message from Conard Novak, MD sent at 02/27/2018  9:52 AM EDT ----- Regarding: No Show Would you mind letting [facility] know that this patient did not show up for her appointment for colposcopy this morning?  They referred her originally for the issue.  Thanks! Thomasene Mohair, MD

## 2018-02-27 NOTE — Telephone Encounter (Signed)
Called and spoke wit hJulie from [facility] letting them know patient no showed schedule appt today

## 2018-03-16 ENCOUNTER — Ambulatory Visit (INDEPENDENT_AMBULATORY_CARE_PROVIDER_SITE_OTHER): Payer: 59 | Admitting: Obstetrics and Gynecology

## 2018-03-16 ENCOUNTER — Other Ambulatory Visit (HOSPITAL_COMMUNITY)
Admission: RE | Admit: 2018-03-16 | Discharge: 2018-03-16 | Disposition: A | Discharge status: Home / Self Care | Payer: 59 | Source: Ambulatory Visit | Attending: Obstetrics and Gynecology | Admitting: Obstetrics and Gynecology

## 2018-03-16 ENCOUNTER — Encounter: Payer: Self-pay | Admitting: Obstetrics and Gynecology

## 2018-03-16 VITALS — BP 114/70 | Ht 61.0 in | Wt 133.0 lb

## 2018-03-16 DIAGNOSIS — N871 Moderate cervical dysplasia: Secondary | ICD-10-CM

## 2018-03-16 DIAGNOSIS — Z09 Encounter for follow-up examination after completed treatment for conditions other than malignant neoplasm: Secondary | ICD-10-CM | POA: Diagnosis not present

## 2018-03-16 DIAGNOSIS — Z87898 Personal history of other specified conditions: Secondary | ICD-10-CM | POA: Diagnosis not present

## 2018-03-16 DIAGNOSIS — R87618 Other abnormal cytological findings on specimens from cervix uteri: Secondary | ICD-10-CM

## 2018-03-16 DIAGNOSIS — R8789 Other abnormal findings in specimens from female genital organs: Secondary | ICD-10-CM | POA: Diagnosis not present

## 2018-03-16 DIAGNOSIS — A630 Anogenital (venereal) warts: Secondary | ICD-10-CM | POA: Diagnosis not present

## 2018-03-16 NOTE — Progress Notes (Signed)
HPI:  Tiffany Bradford is a 47 y.o.  [id number]  who presents today for evaluation and management of abnormal cervical cytology.    Dysplasia History:  ASC-H 2018, s/p LEEP 2018 (CIN 2, negative margins) 11/2017, NILM, HPV+   OB History  Gravida Para Term Preterm AB Living  5 3 3   2 3   SAB TAB Ectopic Multiple Live Births  2       3    # Outcome Date GA Lbr Len/2nd Weight Sex Delivery Anes PTL Lv  5 SAB           4 SAB           3 Term     M Vag-Spont  N LIV  2 Term     M Vag-Spont  N LIV  1 Term     F Vag-Spont  N LIV    Past Medical History:  Diagnosis Date  . Abnormal Pap smear of cervix   . Anxiety   . Gestational diabetes   . Hypertension   . Hypothyroidism   . Pre-eclampsia   . Tension headache   . TMJ (dislocation of temporomandibular joint)     History reviewed. No pertinent surgical history.  SOCIAL HISTORY:  Social History   Substance and Sexual Activity  Alcohol Use No    Social History   Substance and Sexual Activity  Drug Use No     History reviewed. No pertinent family history.  ALLERGIES:  Penicillins  Current Outpatient Medications on File Prior to Visit  Medication Sig Dispense Refill  . clonazePAM (KLONOPIN) 0.5 MG tablet Take 0.5 mg by mouth 2 (two) times daily as needed for anxiety.    . cyclobenzaprine (FLEXERIL) 10 MG tablet Take 1 tablet (10 mg total) by mouth at bedtime. Reported on 07/12/2015 (Patient not taking: Reported on 10/17/2016) 30 tablet 0  . escitalopram (LEXAPRO) 5 MG tablet Take 5 mg by mouth.    . escitalopram (LEXAPRO) 5 MG tablet   3  . gabapentin (NEURONTIN) 300 MG capsule Take 1 capsule (300 mg total) by mouth 2 (two) times daily. (Patient not taking: Reported on 12/10/2017) 28 capsule 0  . HYDROcodone-acetaminophen (NORCO) 5-325 MG tablet Take 1 tablet by mouth every 6 (six) hours as needed (breakthrough pain). (Patient not taking: Reported on 01/06/2017) 15 tablet 0  . ibuprofen (ADVIL,MOTRIN) 600 MG tablet  Take 1 tablet (600 mg total) by mouth every 6 (six) hours as needed for mild pain or cramping. (Patient not taking: Reported on 01/06/2017) 30 tablet 0  . levothyroxine (SYNTHROID, LEVOTHROID) 88 MCG tablet   3  . lisinopril (PRINIVIL,ZESTRIL) 10 MG tablet   11  . potassium chloride (K-DUR) 10 MEQ tablet Take 1 tablet (10 mEq total) by mouth 2 (two) times daily. (Patient not taking: Reported on 12/10/2017) 20 tablet 0  . tiZANidine (ZANAFLEX) 4 MG tablet Take 3 pills at night     No current facility-administered medications on file prior to visit.     Physical Exam: -Vitals:  BP 114/70   Ht 5\' 1"  (1.549 m)   Wt 133 lb (60.3 kg)   BMI 25.13 kg/m  GEN: WD, WN, NAD.  A+ O x 3, good mood and affect. ABD:  NT, ND.  Soft, no masses.  No hernias noted.   Pelvic:   Vulva: Normal appearance.  No lesions.  Vagina: No lesions or abnormalities noted.  Support: Normal pelvic support.  Urethra No masses tenderness or scarring.  Meatus Normal size without lesions or prolapse.  Cervix: See below.  Anus: Normal exam.  No lesions.  Perineum: Normal exam.  No lesions.        Bimanual   Uterus: Normal size.  Non-tender.  Mobile.  AV.  Adnexae: No masses.  Non-tender to palpation.  Cul-de-sac: Negative for abnormality.   PROCEDURE: 1.  Urine Pregnancy Test:  not done (no menses in 1 year) 2.  Colposcopy performed with 4% acetic acid after verbal consent obtained                                         -Aceto-white Lesions Location(s): 3 and 12 o'clock.              -Biopsy performed at 3 and 12 o'clock               -ECC indicated and performed: Yes.       -Biopsy sites made hemostatic with pressure, AgNO3, and/or Monsel's solution   -Satisfactory colposcopy: No.    -Evidence of Invasive cervical CA :  NO  ASSESSMENT:  Tiffany Bradford is a 47 y.o. [phone] here for  1. CIN II (cervical intraepithelial neoplasia II)   2. Pap smear abnormality of cervix/human papillomavirus (HPV) positive     .  PLAN: I discussed the grading system of pap smears and HPV high risk viral types.  We will discuss and base management after colpo results return.     Thomasene MohairStephen Calbert Hulsebus, MD  [facility], [facility] 03/16/2018  4:15 PM

## 2018-03-18 DIAGNOSIS — F411 Generalized anxiety disorder: Secondary | ICD-10-CM | POA: Diagnosis not present

## 2018-03-18 DIAGNOSIS — M545 Low back pain: Secondary | ICD-10-CM | POA: Diagnosis not present

## 2018-03-19 ENCOUNTER — Telehealth: Payer: Self-pay | Admitting: Obstetrics and Gynecology

## 2018-03-19 ENCOUNTER — Encounter: Payer: Self-pay | Admitting: Obstetrics and Gynecology

## 2018-03-19 NOTE — Telephone Encounter (Signed)
Discussed normal results. Recommend follow up pap smear in one year. Will send letter for her records.

## 2018-03-27 DIAGNOSIS — E039 Hypothyroidism, unspecified: Secondary | ICD-10-CM | POA: Diagnosis not present

## 2018-03-27 DIAGNOSIS — F410 Panic disorder [episodic paroxysmal anxiety] without agoraphobia: Secondary | ICD-10-CM | POA: Diagnosis not present

## 2018-03-27 DIAGNOSIS — M545 Low back pain: Secondary | ICD-10-CM | POA: Diagnosis not present

## 2018-03-27 DIAGNOSIS — I10 Essential (primary) hypertension: Secondary | ICD-10-CM | POA: Diagnosis not present

## 2018-05-11 DIAGNOSIS — H8110 Benign paroxysmal vertigo, unspecified ear: Secondary | ICD-10-CM | POA: Diagnosis not present

## 2018-05-28 DIAGNOSIS — J069 Acute upper respiratory infection, unspecified: Secondary | ICD-10-CM | POA: Diagnosis not present

## 2019-04-30 DIAGNOSIS — E039 Hypothyroidism, unspecified: Secondary | ICD-10-CM | POA: Diagnosis not present

## 2019-04-30 DIAGNOSIS — Z0000 Encounter for general adult medical examination without abnormal findings: Secondary | ICD-10-CM | POA: Diagnosis not present

## 2019-04-30 DIAGNOSIS — I10 Essential (primary) hypertension: Secondary | ICD-10-CM | POA: Diagnosis not present

## 2019-05-12 ENCOUNTER — Encounter: Payer: Self-pay | Admitting: *Deleted

## 2019-05-12 ENCOUNTER — Emergency Department: Payer: 59

## 2019-05-12 ENCOUNTER — Other Ambulatory Visit: Payer: Self-pay | Admitting: Family Medicine

## 2019-05-12 ENCOUNTER — Other Ambulatory Visit: Payer: Self-pay

## 2019-05-12 DIAGNOSIS — G4489 Other headache syndrome: Secondary | ICD-10-CM | POA: Diagnosis not present

## 2019-05-12 DIAGNOSIS — I10 Essential (primary) hypertension: Secondary | ICD-10-CM | POA: Diagnosis not present

## 2019-05-12 DIAGNOSIS — R002 Palpitations: Secondary | ICD-10-CM | POA: Diagnosis not present

## 2019-05-12 DIAGNOSIS — R42 Dizziness and giddiness: Secondary | ICD-10-CM | POA: Insufficient documentation

## 2019-05-12 DIAGNOSIS — Z79899 Other long term (current) drug therapy: Secondary | ICD-10-CM | POA: Diagnosis not present

## 2019-05-12 DIAGNOSIS — R0789 Other chest pain: Secondary | ICD-10-CM | POA: Insufficient documentation

## 2019-05-12 DIAGNOSIS — Z1231 Encounter for screening mammogram for malignant neoplasm of breast: Secondary | ICD-10-CM

## 2019-05-12 DIAGNOSIS — R000 Tachycardia, unspecified: Secondary | ICD-10-CM | POA: Diagnosis not present

## 2019-05-12 DIAGNOSIS — E039 Hypothyroidism, unspecified: Secondary | ICD-10-CM | POA: Diagnosis not present

## 2019-05-12 DIAGNOSIS — R0602 Shortness of breath: Secondary | ICD-10-CM | POA: Insufficient documentation

## 2019-05-12 DIAGNOSIS — R5381 Other malaise: Secondary | ICD-10-CM | POA: Diagnosis not present

## 2019-05-12 LAB — BASIC METABOLIC PANEL
Anion gap: 8 (ref 5–15)
BUN: 21 mg/dL — ABNORMAL HIGH (ref 6–20)
CO2: 23 mmol/L (ref 22–32)
Calcium: 9.1 mg/dL (ref 8.9–10.3)
Chloride: 104 mmol/L (ref 98–111)
Creatinine, Ser: 0.64 mg/dL (ref 0.44–1.00)
GFR calc Af Amer: >60 mL/min (ref >60)
GFR calc non Af Amer: >60 mL/min (ref >60)
Glucose, Bld: 122 mg/dL — ABNORMAL HIGH (ref 70–99)
Potassium: 3.5 mmol/L (ref 3.5–5.1)
Sodium: 135 mmol/L (ref 135–145)

## 2019-05-12 LAB — CBC
HCT: 35.2 % — ABNORMAL LOW (ref 36.0–46.0)
Hemoglobin: 11.8 g/dL — ABNORMAL LOW (ref 12.0–15.0)
MCH: 31.5 pg (ref 26.0–34.0)
MCHC: 33.5 g/dL (ref 30.0–36.0)
MCV: 93.9 fL (ref 80.0–100.0)
Platelets: 256 10*3/uL (ref 150–400)
RBC: 3.75 MIL/uL — ABNORMAL LOW (ref 3.87–5.11)
RDW: 12.5 % (ref 11.5–15.5)
WBC: 10.6 10*3/uL — ABNORMAL HIGH (ref 4.0–10.5)
nRBC: 0 % (ref 0.0–0.2)

## 2019-05-12 MED ORDER — SODIUM CHLORIDE 0.9% FLUSH: 3.0000 mL | Freq: Once | INTRAVENOUS | Status: DC

## 2019-05-12 NOTE — ED Triage Notes (Signed)
Pt arrives from home via [facility]. Per report, she had episode where she became lightheaded while at rest, symptoms improved, initial bp 160/106, hr 102. Improved to 158/81. Hx of HTN, did not take her meds. Negative stroke screen.   Pt says she was sitting on her couch and she became dizzy. She said she got up and felt like her heart was racing. She has a history of HTN, did not take her meds today because she forgot. She said does feel somewhat better now. Equal grip, speech clear.

## 2019-05-12 NOTE — ED Notes (Signed)
Patient transported to CT 

## 2019-05-13 ENCOUNTER — Emergency Department
Admission: EM | Admit: 2019-05-13 | Discharge: 2019-05-13 | Disposition: A | Discharge status: Home / Self Care | Payer: 59 | Attending: Emergency Medicine | Admitting: Emergency Medicine

## 2019-05-13 DIAGNOSIS — R002 Palpitations: Secondary | ICD-10-CM

## 2019-05-13 LAB — URINALYSIS, COMPLETE (UACMP) WITH MICROSCOPIC
Bilirubin Urine: NEGATIVE
Glucose, UA: NEGATIVE mg/dL
Hgb urine dipstick: NEGATIVE
Ketones, ur: NEGATIVE mg/dL
Nitrite: NEGATIVE
Protein, ur: NEGATIVE mg/dL
Specific Gravity, Urine: 1.013 (ref 1.005–1.030)
pH: 6 (ref 5.0–8.0)

## 2019-05-13 LAB — TROPONIN I (HIGH SENSITIVITY): Troponin I (High Sensitivity): 4 ng/L (ref <18)

## 2019-05-13 NOTE — ED Notes (Signed)
Pt st her PCP at [facility] told her that based on her BP trends she could stop her BP medication (Lisinopril ) as long as she "ate right and exercised". Pt reports "its been 2 weeks since I've taken it". Pt st that when she became dizzy "it felt like the room was getting dark and my heart started racing"/ pt reports that the dizzyness lasted about 3 minutes but the palpi\tations lasted even after EMS arrived. Pt st "I felt like I was gonna pass out, so I put cold water on my face and it helped".

## 2019-05-13 NOTE — Discharge Instructions (Signed)
Your evaluation tonight was reassuring with no evidence of an emergent medical condition.  Sometimes palpitations and the other symptoms you described are caused by something called paroxysmal supraventricular tachycardia (PSVT).  This is a generally benign condition, but we recommend you follow up with your doctor to discuss your symptoms.  Please read through the included information and try some of the techniques we discussed the next time you have the symptoms.  Return to the Emergency Department if you develop new or worsening symptoms that concern you.

## 2019-05-13 NOTE — ED Notes (Signed)
Pt up to restroom with no assistance. Pt denies any dizziness when standing

## 2019-05-13 NOTE — ED Notes (Signed)
Pt lying in bed, eyes open in NAD. VS stable and nothing needed from staff at this time.

## 2019-05-13 NOTE — ED Provider Notes (Signed)
[facility] Emergency Department Provider Note  ____________________________________________   First MD Initiated Contact with Patient 05/13/19 [phone]     (approximate)  I have reviewed the triage vital signs and the nursing notes.   HISTORY  Chief Complaint Dizziness    HPI Tiffany Bradford is a 48 y.o. female with medical history as listed below who presents for evaluation after an episode earlier tonight.  She says that she was watching TV and not doing anything active when all of a sudden she felt her heart was beating very fast.  Very shortly thereafter she felt a little bit short of breath with some heaviness in her chest and she felt dizzy.  The episode lasted about 15 minutes and she called 911.  Afterwards she felt much better  and she has been asymptomatic for the 7 hours she has been waiting to be evaluated in the emergency department.  She does not take any estrogen replacement and has no history of blood clots in the legs of the lungs.  She has not been ill recently and has had no known contact with COVID-19 patients.  She denies fever/chills, chest discomfort or shortness of breath except as described above, nausea, vomiting, weakness in her arms or legs, or abdominal pain.  No recent dysuria.  She describes the symptoms as acute in onset and severe and have completely resolved.        Past Medical History:  Diagnosis Date  . Abnormal Pap smear of cervix   . Anxiety   . Gestational diabetes   . Hypertension   . Hypothyroidism   . Pre-eclampsia   . Tension headache   . TMJ (dislocation of temporomandibular joint)     Patient Active Problem List   Diagnosis Date Noted  . CIN II (cervical intraepithelial neoplasia II) 11/28/2016  . Atypical squamous cells cannot exclude high grade squamous intraepithelial lesion on cytologic smear of cervix (ASC-H) 10/17/2016  . Ache in joint 09/28/2014  . 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  Headache 06/02/2014  . Acute onset aura migraine 05/25/2014  . Adult hypothyroidism 05/25/2014  . ANA positive 04/06/2014  . De Quervain's disease (radial styloid tenosynovitis) 04/06/2014    History reviewed. No pertinent surgical history.  Prior to Admission medications   Medication Sig Start Date End Date Taking? Authorizing Provider  clonazePAM (KLONOPIN) 0.5 MG tablet Take 0.5 mg by mouth 2 (two) times daily as needed for anxiety.    [provider]  cyclobenzaprine (FLEXERIL) 10 MG tablet Take 1 tablet (10 mg total) by mouth at bedtime. Reported on 07/12/2015 Patient not taking: Reported on 10/17/2016 05/24/16   Faythe Ghee, PA-C  escitalopram (LEXAPRO) 5 MG tablet Take 5 mg by mouth. 12/29/15 12/28/16  [provider]  escitalopram (LEXAPRO) 5 MG tablet  01/03/17   [provider]  gabapentin (NEURONTIN) 300 MG capsule Take 1 capsule (300 mg total) by mouth 2 (two) times daily. Patient not taking: Reported on 12/10/2017 06/04/15   Sharyn Creamer, MD  HYDROcodone-acetaminophen (NORCO) 5-325 MG tablet Take 1 tablet by mouth every 6 (six) hours as needed (breakthrough pain). Patient not taking: Reported on 01/06/2017 12/11/16   Conard Novak, MD  ibuprofen (ADVIL,MOTRIN) 600 MG tablet Take 1 tablet (600 mg total) by mouth every 6 (six) hours as needed for mild pain or cramping. Patient not taking: Reported on 01/06/2017 12/11/16   Conard Novak, MD  levothyroxine Erline Levine, LEVOTHROID) 88 MCG tablet  05/17/15  [provider]  lisinopril (PRINIVIL,ZESTRIL) 10 MG tablet  06/02/15   [provider]  potassium chloride (K-DUR) 10 MEQ tablet Take 1 tablet (10 mEq total) by mouth 2 (two) times daily. Patient not taking: Reported on 12/10/2017 11/11/14   Ahmed Prima, MD  tiZANidine (ZANAFLEX) 4 MG tablet Take 3 pills at night 01/03/17   [provider]    Allergies Penicillins  No family history on file.  Social History Social  History   Tobacco Use  . Smoking status: Never Smoker  . Smokeless tobacco: Never Used  Substance Use Topics  . Alcohol use: No  . Drug use: No    Review of Systems Constitutional: No fever/chills Eyes: No visual changes. ENT: No sore throat. Cardiovascular: Palpitations with some associated chest tightness. Respiratory: Mild shortness of breath associated with the episode of palpitations. Gastrointestinal: No abdominal pain.  No nausea, no vomiting.  No diarrhea.  No constipation. Genitourinary: Negative for dysuria. Musculoskeletal: Negative for neck pain.  Negative for back pain. Integumentary: Negative for rash. Neurological: Brief episode of dizziness.  Negative for headaches, focal weakness or numbness.   ____________________________________________   PHYSICAL EXAM:  VITAL SIGNS: ED Triage Vitals  Enc Vitals Group     BP 05/12/19 2325 (!) 144/86     Pulse Rate 05/12/19 2325 92     Resp 05/12/19 2325 16     Temp 05/12/19 2325 98.7 F (37.1 C)     Temp Source 05/13/19 0447 Oral     SpO2 05/12/19 2325 100 %     Weight 05/13/19 0441 59.9 kg (132 lb)     Height 05/13/19 0441 1.575 m (5\' 2" )     Head Circumference --      Peak Flow --      Pain Score 05/12/19 2325 0     Pain Loc --      Pain Edu? --      Excl. in [place]? --     Constitutional: Alert and oriented.  Eyes: Conjunctivae are normal.  Head: Atraumatic. Nose: No congestion/rhinnorhea. Mouth/Throat: Patient is wearing a mask. Neck: No stridor.  No meningeal signs.   Cardiovascular: Normal rate, regular rhythm. Good peripheral circulation. Grossly normal heart sounds. Respiratory: Normal respiratory effort.  No retractions. Gastrointestinal: Soft and nontender. No distention.  Musculoskeletal: No lower extremity tenderness nor edema. No gross deformities of extremities. Neurologic:  Normal speech and language. No gross focal neurologic deficits are appreciated.  Skin:  Skin is warm, dry and intact.  Psychiatric: Mood and affect are normal. Speech and behavior are normal.  ____________________________________________   LABS (all labs ordered are listed, but only abnormal results are displayed)  Labs Reviewed  BASIC METABOLIC PANEL - Abnormal; Notable for the following components:      Result Value   Glucose, Bld 122 (*)    BUN 21 (*)    All other components within normal limits  CBC - Abnormal; Notable for the following components:   WBC 10.6 (*)    RBC 3.75 (*)    Hemoglobin 11.8 (*)    HCT 35.2 (*)    All other components within normal limits  URINALYSIS, COMPLETE (UACMP) WITH MICROSCOPIC - Abnormal; Notable for the following components:   Color, Urine STRAW (*)    APPearance CLEAR (*)    Leukocytes,Ua LARGE (*)    Bacteria, UA RARE (*)    All other components within normal limits  CBG MONITORING, ED  TROPONIN I (HIGH SENSITIVITY)   ____________________________________________  EKG  ED ECG REPORT I, Loleta Rose, the attending physician, personally viewed and interpreted this ECG.  Date: 05/12/2019 EKG Time: 23: 30 Rate: 87 Rhythm: normal sinus rhythm QRS Axis: normal Intervals: normal ST/T Wave abnormalities: normal Narrative Interpretation: no evidence of acute ischemia  ____________________________________________  RADIOLOGY I, Loleta Rose, personally viewed and evaluated these images (plain radiographs) as part of my medical decision making, as well as reviewing the written report by the radiologist.  ED MD interpretation: Normal head CT without any evidence of acute or emergent abnormality  Official radiology report(s): Ct Head Wo Contrast  Result Date: 05/12/2019 CLINICAL DATA:  Dizziness EXAM: CT HEAD WITHOUT CONTRAST TECHNIQUE: Contiguous axial images were obtained from the base of the skull through the vertex without intravenous contrast. COMPARISON:  04/09/2016 FINDINGS: Brain: No acute intracranial abnormality. Specifically, no hemorrhage,  hydrocephalus, mass lesion, acute infarction, or significant intracranial injury. Vascular: No hyperdense vessel or unexpected calcification. Skull: No acute calvarial abnormality. Sinuses/Orbits: Visualized paranasal sinuses and mastoids clear. Orbital soft tissues unremarkable. Other: None IMPRESSION: Normal study. Electronically Signed   By: Charlett Nose M.D.   On: 05/12/2019 23:56    ____________________________________________   PROCEDURES   Procedure(s) performed (including Critical Care):  Procedures   ____________________________________________   INITIAL IMPRESSION / MDM / ASSESSMENT AND PLAN / ED COURSE  As part of my medical decision making, I reviewed the following data within the electronic MEDICAL RECORD NUMBER Nursing notes reviewed and incorporated, Labs reviewed , EKG interpreted , Old chart reviewed, Notes from prior ED visits and [place] Controlled Substance Database   Differential diagnosis includes, but is not limited to, SVT/AVNRT or other nonspecific benign cardiac arrhythmia, A. fib with RVR, much less likely V. tach, anxiety or panic attack is also possible.  PE unlikely and the patient is PERC negative.  She did not have chest pain, only had a bit of tightness associated with the palpitations and she has been pain-free since coming to the emergency department.  Her high-sensitivity troponin was 4 and she is low risk for ACS based on HEAR score, no indication for additional testing.  Based on the description of her symptoms I think that SVT is unlikely, possibly contributed to by anxiety.  I had my usual and customary palpitations/SVT discussion with the patient and she is comfortable with the plan for discharge and follow-up with her regular doctor her labs are reassuring with no evidence of electrolyte abnormality or acute infection or ACS.  I gave my usual customary return precautions.          ____________________________________________  FINAL CLINICAL  IMPRESSION(S) / ED DIAGNOSES  Final diagnoses:  Palpitations     MEDICATIONS GIVEN DURING THIS VISIT:  Medications  sodium chloride flush (NS) 0.9 % injection 3 mL (has no administration in time range)     ED Discharge Orders    None      *Please note:  Ellaree Gear was evaluated in Emergency Department on 05/13/2019 for the symptoms described in the history of present illness. She was evaluated in the context of the global COVID-19 pandemic, which necessitated consideration that the patient might be at risk for infection with the SARS-CoV-2 virus that causes COVID-19. Institutional protocols and algorithms that pertain to the evaluation of patients at risk for COVID-19 are in a state of rapid change based on information released by regulatory bodies including the CDC and federal and state organizations. These policies and algorithms were followed during the patient's care in the  ED.  Some ED evaluations and interventions may be delayed as a result of limited staffing during the pandemic.*  Note:  This document was prepared using Dragon voice recognition software and may include unintentional dictation errors.   Loleta RoseForbach, Theador Jezewski, MD 05/13/19 [phone]

## 2019-06-28 DIAGNOSIS — Z1389 Encounter for screening for other disorder: Secondary | ICD-10-CM | POA: Diagnosis not present

## 2019-06-28 DIAGNOSIS — H8110 Benign paroxysmal vertigo, unspecified ear: Secondary | ICD-10-CM | POA: Diagnosis not present

## 2019-06-29 DIAGNOSIS — Z20828 Contact with and (suspected) exposure to other viral communicable diseases: Secondary | ICD-10-CM | POA: Diagnosis not present

## 2019-07-13 ENCOUNTER — Emergency Department: Payer: 59

## 2019-07-13 ENCOUNTER — Emergency Department
Admission: EM | Admit: 2019-07-13 | Discharge: 2019-07-14 | Disposition: A | Discharge status: Home / Self Care | Payer: 59 | Attending: Emergency Medicine | Admitting: Emergency Medicine

## 2019-07-13 ENCOUNTER — Other Ambulatory Visit: Payer: Self-pay

## 2019-07-13 ENCOUNTER — Encounter: Payer: Self-pay | Admitting: Emergency Medicine

## 2019-07-13 DIAGNOSIS — Z79899 Other long term (current) drug therapy: Secondary | ICD-10-CM | POA: Diagnosis not present

## 2019-07-13 DIAGNOSIS — I493 Ventricular premature depolarization: Secondary | ICD-10-CM | POA: Diagnosis not present

## 2019-07-13 DIAGNOSIS — R000 Tachycardia, unspecified: Secondary | ICD-10-CM | POA: Diagnosis not present

## 2019-07-13 DIAGNOSIS — F419 Anxiety disorder, unspecified: Secondary | ICD-10-CM | POA: Insufficient documentation

## 2019-07-13 DIAGNOSIS — I10 Essential (primary) hypertension: Secondary | ICD-10-CM | POA: Diagnosis not present

## 2019-07-13 DIAGNOSIS — E039 Hypothyroidism, unspecified: Secondary | ICD-10-CM | POA: Insufficient documentation

## 2019-07-13 DIAGNOSIS — E876 Hypokalemia: Secondary | ICD-10-CM | POA: Diagnosis not present

## 2019-07-13 DIAGNOSIS — R531 Weakness: Secondary | ICD-10-CM | POA: Diagnosis not present

## 2019-07-13 DIAGNOSIS — R457 State of emotional shock and stress, unspecified: Secondary | ICD-10-CM | POA: Diagnosis not present

## 2019-07-13 DIAGNOSIS — R002 Palpitations: Secondary | ICD-10-CM | POA: Diagnosis not present

## 2019-07-13 LAB — CBC
HCT: 37.7 % (ref 36.0–46.0)
Hemoglobin: 12.6 g/dL (ref 12.0–15.0)
MCH: 31.1 pg (ref 26.0–34.0)
MCHC: 33.4 g/dL (ref 30.0–36.0)
MCV: 93.1 fL (ref 80.0–100.0)
Platelets: 250 10*3/uL (ref 150–400)
RBC: 4.05 MIL/uL (ref 3.87–5.11)
RDW: 12.1 % (ref 11.5–15.5)
WBC: 8.4 10*3/uL (ref 4.0–10.5)
nRBC: 0 % (ref 0.0–0.2)

## 2019-07-13 LAB — BASIC METABOLIC PANEL
Anion gap: 9 (ref 5–15)
BUN: 17 mg/dL (ref 6–20)
CO2: 24 mmol/L (ref 22–32)
Calcium: 9.2 mg/dL (ref 8.9–10.3)
Chloride: 102 mmol/L (ref 98–111)
Creatinine, Ser: 0.7 mg/dL (ref 0.44–1.00)
GFR calc Af Amer: >60 mL/min (ref >60)
GFR calc non Af Amer: >60 mL/min (ref >60)
Glucose, Bld: 152 mg/dL — ABNORMAL HIGH (ref 70–99)
Potassium: 3 mmol/L — ABNORMAL LOW (ref 3.5–5.1)
Sodium: 135 mmol/L (ref 135–145)

## 2019-07-13 LAB — TROPONIN I (HIGH SENSITIVITY): Troponin I (High Sensitivity): 4 ng/L (ref <18)

## 2019-07-13 MED ORDER — POTASSIUM CHLORIDE 20 MEQ/15ML (10%) PO SOLN
40.0000 meq | Freq: Once | ORAL | Status: AC
  Administered 2019-07-14: 01:00:00 40 meq via ORAL
  Filled 2019-07-13: qty 30

## 2019-07-13 NOTE — ED Triage Notes (Signed)
Pt arrived via [facility] with reports of palpitations that started about 1 hour ago. PT states this happened to her 2 months ago, states she thought it was anxiety and took anxiety medication about 30 mins ago, but states it has not helped. PT denies any pain, but feels like heart is beating out of her chest.  Pt also states her BP was elevated. Pt states she felt fine this morning.   Per EMS EKG, pt was in bigeminy. On arrival EKG shows NSR, pt states she is feeling better at this time.

## 2019-07-14 DIAGNOSIS — I10 Essential (primary) hypertension: Secondary | ICD-10-CM | POA: Diagnosis not present

## 2019-07-14 DIAGNOSIS — I493 Ventricular premature depolarization: Secondary | ICD-10-CM | POA: Diagnosis not present

## 2019-07-14 DIAGNOSIS — E876 Hypokalemia: Secondary | ICD-10-CM | POA: Diagnosis not present

## 2019-07-14 DIAGNOSIS — E039 Hypothyroidism, unspecified: Secondary | ICD-10-CM | POA: Diagnosis not present

## 2019-07-14 DIAGNOSIS — F419 Anxiety disorder, unspecified: Secondary | ICD-10-CM | POA: Diagnosis not present

## 2019-07-14 DIAGNOSIS — Z79899 Other long term (current) drug therapy: Secondary | ICD-10-CM | POA: Diagnosis not present

## 2019-07-14 LAB — TROPONIN I (HIGH SENSITIVITY): Troponin I (High Sensitivity): 9 ng/L (ref <18)

## 2019-07-14 LAB — POC URINE PREG, ED: Preg Test, Ur: NEGATIVE

## 2019-07-14 NOTE — ED Provider Notes (Addendum)
[facility] Emergency Department Provider Note  ____________________________________________   First MD Initiated Contact with Patient 07/13/19 2343     (approximate)  I have reviewed the triage vital signs and the nursing notes.   HISTORY  Chief Complaint Palpitations    HPI Tiffany Bradford is a 49 y.o. female with below list of previous medical conditions presents to the emergency department secondary to heart palpitations tonight.  Patient describes the episode as being brief lasting only a second however stating that the recur.  Patient states that she has been having these symptoms over the past 2 months.  Patient cannot identify any propagating factor.  Patient states that she thought it may be secondary to anxiety and as such she took her anxiety medication before arrival but states that it did not improve her symptoms.  Per EMS patient was in bigeminy before arrival however on arrival to the emergency department patient noted to be in normal sinus rhythm with no symptoms at present.  Patient denies any chest pain or shortness of breath.  Patient denies any dizziness.        Past Medical History:  Diagnosis Date  . Abnormal Pap smear of cervix   . Anxiety   . Gestational diabetes   . Hypertension   . Hypothyroidism   . Pre-eclampsia   . Tension headache   . TMJ (dislocation of temporomandibular joint)     Patient Active Problem List   Diagnosis Date Noted  . CIN II (cervical intraepithelial neoplasia II) 11/28/2016  . Atypical squamous cells cannot exclude high grade squamous intraepithelial lesion on cytologic smear of cervix (ASC-H) 10/17/2016  . Ache in joint 09/28/2014  . 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 Headache 06/02/2014  . Acute onset aura migraine 05/25/2014  . Adult hypothyroidism 05/25/2014  . ANA positive 04/06/2014  . De Quervain's disease (radial styloid tenosynovitis) 04/06/2014    History reviewed. No  pertinent surgical history.  Prior to Admission medications   Medication Sig Start Date End Date Taking? Authorizing Provider  clonazePAM (KLONOPIN) 0.5 MG tablet Take 0.5 mg by mouth 2 (two) times daily as needed for anxiety.    [provider]  cyclobenzaprine (FLEXERIL) 10 MG tablet Take 1 tablet (10 mg total) by mouth at bedtime. Reported on 07/12/2015 Patient not taking: Reported on 10/17/2016 05/24/16   Versie Starks, PA-C  escitalopram (LEXAPRO) 5 MG tablet Take 5 mg by mouth. 12/29/15 12/28/16  [provider]  escitalopram (LEXAPRO) 5 MG tablet  01/03/17   [provider]  gabapentin (NEURONTIN) 300 MG capsule Take 1 capsule (300 mg total) by mouth 2 (two) times daily. Patient not taking: Reported on 12/10/2017 06/04/15   Delman Kitten, MD  HYDROcodone-acetaminophen (NORCO) 5-325 MG tablet Take 1 tablet by mouth every 6 (six) hours as needed (breakthrough pain). Patient not taking: Reported on 01/06/2017 12/11/16   Will Bonnet, MD  ibuprofen (ADVIL,MOTRIN) 600 MG tablet Take 1 tablet (600 mg total) by mouth every 6 (six) hours as needed for mild pain or cramping. Patient not taking: Reported on 01/06/2017 12/11/16   Will Bonnet, MD  levothyroxine Wilmer Floor, LEVOTHROID) 39 MCG tablet  05/17/15   [provider]  lisinopril (PRINIVIL,ZESTRIL) 10 MG tablet  06/02/15   [provider]  potassium chloride (K-DUR) 10 MEQ tablet Take 1 tablet (10 mEq total) by mouth 2 (two) times daily. Patient not taking: Reported on 12/10/2017 11/11/14   Ahmed Prima, MD  tiZANidine (ZANAFLEX)  4 MG tablet Take 3 pills at night 01/03/17   [provider]    Allergies Penicillins  No family history on file.  Social History Social History   Tobacco Use  . Smoking status: Never Smoker  . Smokeless tobacco: Never Used  Substance Use Topics  . Alcohol use: No  . Drug use: No    Review of Systems Constitutional: No fever/chills Eyes: No  visual changes. ENT: No sore throat. Cardiovascular: Denies chest pain. Respiratory: Denies shortness of breath. Gastrointestinal: No abdominal pain.  No nausea, no vomiting.  No diarrhea.  No constipation. Genitourinary: Negative for dysuria. Musculoskeletal: Negative for neck pain.  Negative for back pain. Integumentary: Negative for rash. Neurological: Negative for headaches, focal weakness or numbness. Psychiatric: Positive for feeling anxious".   ____________________________________________   PHYSICAL EXAM:  VITAL SIGNS: ED Triage Vitals  Enc Vitals Group     BP 07/13/19 2125 (!) 151/79     Pulse Rate 07/13/19 2125 92     Resp 07/13/19 2125 18     Temp 07/13/19 2125 97.7 F (36.5 C)     Temp Source 07/13/19 2125 Oral     SpO2 07/13/19 2125 100 %     Weight 07/13/19 2122 62.6 kg (138 lb)     Height 07/13/19 2122 1.549 m (5\' 1" )     Head Circumference --      Peak Flow --      Pain Score 07/13/19 2122 0     Pain Loc --      Pain Edu? --      Excl. in GC? --     Constitutional: Alert and oriented.  Eyes: Conjunctivae are normal.  Mouth/Throat: Patient is wearing a mask. Neck: No stridor.  No meningeal signs.   Cardiovascular: Normal rate, regular rhythm. Good peripheral circulation. Grossly normal heart sounds. Respiratory: Normal respiratory effort.  No retractions. Gastrointestinal: Soft and nontender. No distention.  Musculoskeletal: No lower extremity tenderness nor edema. No gross deformities of extremities. Neurologic:  Normal speech and language. No gross focal neurologic deficits are appreciated.  Skin:  Skin is warm, dry and intact. Psychiatric: Mood and affect are normal. Speech and behavior are normal.  ____________________________________________   LABS (all labs ordered are listed, but only abnormal results are displayed)  Labs Reviewed  BASIC METABOLIC PANEL - Abnormal; Notable for the following components:      Result Value   Potassium 3.0 (*)     Glucose, Bld 152 (*)    All other components within normal limits  POC URINE PREG, ED - Normal  CBC  TROPONIN I (HIGH SENSITIVITY)  TROPONIN I (HIGH SENSITIVITY)   ____________________________________________  EKG  ED ECG REPORT I, Roscoe N Erdem Naas, the attending physician, personally viewed and interpreted this ECG.   Date: 07/14/2019  EKG Time: 9:20 PM  Rate: 87  Rhythm: Normal sinus rhythm  Axis: Normal  Intervals: Normal  ST&T Change: None  ____________________________________________  RADIOLOGY I, Cleves N Annagrace Carr, personally viewed and evaluated these images (plain radiographs) as part of my medical decision making, as well as reviewing the written report by the radiologist.  ED MD interpretation: No active cardiopulmonary disease noted on chest x-ray.  Official radiology report(s): DG Chest 2 View  Result Date: 07/13/2019 CLINICAL DATA:  Palpitations EXAM: CHEST - 2 VIEW COMPARISON:  None. FINDINGS: The heart size and mediastinal contours are within normal limits. Both lungs are clear. The visualized skeletal structures are unremarkable. IMPRESSION: No active cardiopulmonary disease. Electronically Signed  By: Deatra Robinson M.D.   On: 07/13/2019 21:56    ____________________________________________     Procedures   ____________________________________________   INITIAL IMPRESSION / MDM / ASSESSMENT AND PLAN / ED COURSE  As part of my medical decision making, I reviewed the following data within the electronic MEDICAL RECORD NUMBER    49 year old female presented with above-stated history and physical exam secondary to heart palpitation.  During my evaluation patient noted to have premature ventricular contraction on the monitor which was consistent with the feeling she had at home.  Patient's laboratory data notable for hypokalemia.  Patient given 40 mEq of potassium chloride orally.  Patient will be referred to Dr. Lady Gary for further outpatient  evaluation  ____________________________________________  FINAL CLINICAL IMPRESSION(S) / ED DIAGNOSES  Final diagnoses:  PVC (premature ventricular contraction)     MEDICATIONS GIVEN DURING THIS VISIT:  Medications  potassium chloride 20 MEQ/15ML (10%) solution 40 mEq (has no administration in time range)     ED Discharge Orders    None      *Please note:  Ermina Oberman was evaluated in Emergency Department on 07/14/2019 for the symptoms described in the history of present illness. She was evaluated in the context of the global COVID-19 pandemic, which necessitated consideration that the patient might be at risk for infection with the SARS-CoV-2 virus that causes COVID-19. Institutional protocols and algorithms that pertain to the evaluation of patients at risk for COVID-19 are in a state of rapid change based on information released by regulatory bodies including the CDC and federal and state organizations. These policies and algorithms were followed during the patient's care in the ED.  Some ED evaluations and interventions may be delayed as a result of limited staffing during the pandemic.*  Note:  This document was prepared using Dragon voice recognition software and may include unintentional dictation errors.   Darci Current, MD 07/14/19 9147    Darci Current, MD 07/14/19 [phone]

## 2019-07-16 DIAGNOSIS — R002 Palpitations: Secondary | ICD-10-CM | POA: Diagnosis not present

## 2019-07-26 DIAGNOSIS — I10 Essential (primary) hypertension: Secondary | ICD-10-CM | POA: Diagnosis not present

## 2019-07-26 DIAGNOSIS — R002 Palpitations: Secondary | ICD-10-CM | POA: Diagnosis not present

## 2019-08-06 DIAGNOSIS — R002 Palpitations: Secondary | ICD-10-CM | POA: Diagnosis not present

## 2019-08-09 DIAGNOSIS — I493 Ventricular premature depolarization: Secondary | ICD-10-CM | POA: Diagnosis not present

## 2019-08-09 DIAGNOSIS — R002 Palpitations: Secondary | ICD-10-CM | POA: Diagnosis not present

## 2019-08-09 DIAGNOSIS — I10 Essential (primary) hypertension: Secondary | ICD-10-CM | POA: Diagnosis not present

## 2019-10-09 DIAGNOSIS — R079 Chest pain, unspecified: Secondary | ICD-10-CM | POA: Diagnosis not present

## 2019-10-09 DIAGNOSIS — Z79899 Other long term (current) drug therapy: Secondary | ICD-10-CM | POA: Diagnosis not present

## 2019-10-09 DIAGNOSIS — M329 Systemic lupus erythematosus, unspecified: Secondary | ICD-10-CM | POA: Diagnosis not present

## 2019-10-09 DIAGNOSIS — R002 Palpitations: Secondary | ICD-10-CM | POA: Diagnosis not present

## 2019-10-09 DIAGNOSIS — F419 Anxiety disorder, unspecified: Secondary | ICD-10-CM | POA: Diagnosis not present

## 2019-10-09 DIAGNOSIS — I10 Essential (primary) hypertension: Secondary | ICD-10-CM | POA: Diagnosis not present

## 2019-10-09 DIAGNOSIS — E039 Hypothyroidism, unspecified: Secondary | ICD-10-CM | POA: Diagnosis not present

## 2019-10-09 DIAGNOSIS — R251 Tremor, unspecified: Secondary | ICD-10-CM | POA: Diagnosis not present

## 2019-10-11 DIAGNOSIS — F411 Generalized anxiety disorder: Secondary | ICD-10-CM | POA: Diagnosis not present

## 2019-10-11 DIAGNOSIS — R002 Palpitations: Secondary | ICD-10-CM | POA: Diagnosis not present

## 2019-10-11 DIAGNOSIS — E039 Hypothyroidism, unspecified: Secondary | ICD-10-CM | POA: Diagnosis not present

## 2019-11-05 DIAGNOSIS — F411 Generalized anxiety disorder: Secondary | ICD-10-CM | POA: Diagnosis not present

## 2019-11-05 DIAGNOSIS — R002 Palpitations: Secondary | ICD-10-CM | POA: Diagnosis not present

## 2019-11-05 DIAGNOSIS — I10 Essential (primary) hypertension: Secondary | ICD-10-CM | POA: Diagnosis not present

## 2019-11-28 ENCOUNTER — Emergency Department
Admission: EM | Admit: 2019-11-28 | Discharge: 2019-11-29 | Disposition: A | Discharge status: Home / Self Care | Payer: 59 | Attending: Emergency Medicine | Admitting: Emergency Medicine

## 2019-11-28 ENCOUNTER — Other Ambulatory Visit: Payer: Self-pay

## 2019-11-28 DIAGNOSIS — R200 Anesthesia of skin: Secondary | ICD-10-CM | POA: Diagnosis present

## 2019-11-28 DIAGNOSIS — Z79899 Other long term (current) drug therapy: Secondary | ICD-10-CM | POA: Insufficient documentation

## 2019-11-28 DIAGNOSIS — I10 Essential (primary) hypertension: Secondary | ICD-10-CM | POA: Insufficient documentation

## 2019-11-28 DIAGNOSIS — E039 Hypothyroidism, unspecified: Secondary | ICD-10-CM | POA: Diagnosis not present

## 2019-11-28 DIAGNOSIS — F419 Anxiety disorder, unspecified: Secondary | ICD-10-CM | POA: Insufficient documentation

## 2019-11-28 DIAGNOSIS — R202 Paresthesia of skin: Secondary | ICD-10-CM | POA: Diagnosis not present

## 2019-11-28 LAB — BASIC METABOLIC PANEL
Anion gap: 8 (ref 5–15)
BUN: 12 mg/dL (ref 6–20)
CO2: 24 mmol/L (ref 22–32)
Calcium: 9.4 mg/dL (ref 8.9–10.3)
Chloride: 99 mmol/L (ref 98–111)
Creatinine, Ser: 0.61 mg/dL (ref 0.44–1.00)
GFR calc Af Amer: >60 mL/min (ref >60)
GFR calc non Af Amer: >60 mL/min (ref >60)
Glucose, Bld: 123 mg/dL — ABNORMAL HIGH (ref 70–99)
Potassium: 3.5 mmol/L (ref 3.5–5.1)
Sodium: 131 mmol/L — ABNORMAL LOW (ref 135–145)

## 2019-11-28 LAB — URINALYSIS, COMPLETE (UACMP) WITH MICROSCOPIC
Bacteria, UA: NONE SEEN
Bilirubin Urine: NEGATIVE
Glucose, UA: NEGATIVE mg/dL
Hgb urine dipstick: NEGATIVE
Ketones, ur: NEGATIVE mg/dL
Leukocytes,Ua: NEGATIVE
Nitrite: NEGATIVE
Protein, ur: NEGATIVE mg/dL
Specific Gravity, Urine: 1.002 — ABNORMAL LOW (ref 1.005–1.030)
Squamous Epithelial / HPF: NONE SEEN (ref 0–5)
WBC, UA: NONE SEEN WBC/hpf (ref 0–5)
pH: 6 (ref 5.0–8.0)

## 2019-11-28 LAB — CBC
HCT: 36.5 % (ref 36.0–46.0)
Hemoglobin: 12.5 g/dL (ref 12.0–15.0)
MCH: 32.1 pg (ref 26.0–34.0)
MCHC: 34.2 g/dL (ref 30.0–36.0)
MCV: 93.8 fL (ref 80.0–100.0)
Platelets: 234 10*3/uL (ref 150–400)
RBC: 3.89 MIL/uL (ref 3.87–5.11)
RDW: 12 % (ref 11.5–15.5)
WBC: 9.7 10*3/uL (ref 4.0–10.5)
nRBC: 0 % (ref 0.0–0.2)

## 2019-11-28 LAB — POCT PREGNANCY, URINE: Preg Test, Ur: NEGATIVE

## 2019-11-28 NOTE — ED Triage Notes (Addendum)
Pt arrives to ER c/o of tongue and L arm numbness from elbow down that began 30 minutes ago. Denies blurred vision, denies slurred speech. Grip strength strong and equal. Able to move all extremities. A&O, ambulatory. No tongue or lip swelling. Pt states she feels like her heart is pounding.

## 2019-11-28 NOTE — ED Notes (Signed)
Pt now stating that her symptoms started a few days ago and have been intermittent and that she is super anxious and scared because of symptoms.

## 2019-11-28 NOTE — ED Triage Notes (Signed)
First nurse note- pt here for numbness to left arm and tongue starting 30 min ago. Interpretor called to verify timeline and story. Pulled for immediate triage.

## 2019-11-29 LAB — TSH: TSH: 6.208 u[IU]/mL — ABNORMAL HIGH (ref 0.350–4.500)

## 2019-11-29 LAB — T4, FREE: Free T4: 0.93 ng/dL (ref 0.61–1.12)

## 2019-11-29 MED ORDER — PAROXETINE HCL 10 MG PO TABS
ORAL_TABLET | ORAL | 0 refills | Status: AC
Start: 2019-11-29 — End: 2019-12-14

## 2019-11-29 NOTE — Discharge Instructions (Signed)
Start taking paroxetine 10mg  once a day for the next week while waiting for your appointment with your doctor. If symptoms are not controlled in one week, you can increase the paroxetine to 20mg  once a day.  Please discuss this medication change with your doctor.

## 2019-11-29 NOTE — ED Provider Notes (Signed)
[facility] Emergency Department Provider Note  ____________________________________________  Time seen: Approximately 1:14 AM  I have reviewed the triage vital signs and the nursing notes.   HISTORY  Chief Complaint Numbness  Encounter completed with Spanish video interpreter  HPI Tiffany Bradford is a 49 y.o. female with a history of anxiety, hypertension and palpitations who comes ED complaining of numbness of the mouth and left lower arm for the past 3 days.  Resolved by taking Klonopin.  No aggravating factors.  Denies pain.  No vision changes or headache.  No motor weakness or change in balance or coordination.  No falls or trauma.  No recent illness.  Tonight the symptoms reoccurred at about 6:00 PM.  She took a Klonopin 0.5 mg right before coming to the ED and reports that shortly after arrival her symptoms resolved.  They have not recurred in the 5 or 6 hours that she has waited for evaluation, and currently remain resolved.  She feels normal right now.  Denies depression or SI.    Review of electronic medical record shows that in the past she has been prescribed Lexapro and Zoloft, but has not continued either.  Cardiology has started her on metoprolol for symptomatic palpitations.  Past Medical History:  Diagnosis Date  . Abnormal Pap smear of cervix   . Anxiety   . Gestational diabetes   . Hypertension   . Hypothyroidism   . Pre-eclampsia   . Tension headache   . TMJ (dislocation of temporomandibular joint)      Patient Active Problem List   Diagnosis Date Noted  . CIN II (cervical intraepithelial neoplasia II) 11/28/2016  . Atypical squamous cells cannot exclude high grade squamous intraepithelial lesion on cytologic smear of cervix (ASC-H) 10/17/2016  . Ache in joint 09/28/2014  . 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 Headache 06/02/2014  . Acute onset aura migraine 05/25/2014  . Adult hypothyroidism 05/25/2014  . ANA  positive 04/06/2014  . De Quervain's disease (radial styloid tenosynovitis) 04/06/2014     History reviewed. No pertinent surgical history.   Medications Metoprolol Klonopin 0.5 mg as needed   Allergies Penicillins   History reviewed. No pertinent family history.  Social History Social History   Tobacco Use  . Smoking status: Never Smoker  . Smokeless tobacco: Never Used  Substance Use Topics  . Alcohol use: No  . Drug use: No    Review of Systems  Constitutional:   No fever or chills.  ENT:   No sore throat. No rhinorrhea. Cardiovascular:   No chest pain or syncope. Respiratory:   No dyspnea or cough. Gastrointestinal:   Negative for abdominal pain, vomiting and diarrhea.  Musculoskeletal:   Negative for focal pain or swelling All other systems reviewed and are negative except as documented above in ROS and HPI.  ____________________________________________   PHYSICAL EXAM:  VITAL SIGNS: ED Triage Vitals  Enc Vitals Group     BP 11/28/19 1851 (!) 175/81     Pulse Rate 11/28/19 1851 81     Resp 11/28/19 1851 18     Temp --      Temp Source 11/28/19 1851 Oral     SpO2 11/28/19 1851 100 %     Weight 11/28/19 1853 125 lb (56.7 kg)     Height 11/28/19 1853 5\' 1"  (1.549 m)     Head Circumference --      Peak Flow --      Pain Score 11/28/19 1853 0  Pain Loc --      Pain Edu? --      Excl. in GC? --     Vital signs reviewed, nursing assessments reviewed.   Constitutional:   Alert and oriented. Non-toxic appearance. Eyes:   Conjunctivae are normal. EOMI. PERRL.  No nystagmus or proptosis ENT      Head:   Normocephalic and atraumatic.      Nose:   Wearing a mask.      Mouth/Throat:   Wearing a mask.      Neck:   No meningismus. Full ROM.  Thyroid nonpalpable Hematological/Lymphatic/Immunilogical:   No cervical lymphadenopathy. Cardiovascular:   RRR. Symmetric bilateral radial and DP pulses.  No murmurs. Cap refill less than 2  seconds. Respiratory:   Normal respiratory effort without tachypnea/retractions. Breath sounds are clear and equal bilaterally. No wheezes/rales/rhonchi. Gastrointestinal:   Soft and nontender. Non distended. There is no CVA tenderness.  No rebound, rigidity, or guarding. Musculoskeletal:   Normal range of motion in all extremities. No joint effusions.  No lower extremity tenderness.  No edema. Neurologic:   Normal speech and language.  Normal gait.  No pronator drift.  Normal finger-nose-finger. Motor grossly intact. Sensation symmetric No acute focal neurologic deficits are appreciated.  Skin:    Skin is warm, dry and intact. No rash noted.  No petechiae, purpura, or bullae.  ____________________________________________    LABS (pertinent positives/negatives) (all labs ordered are listed, but only abnormal results are displayed) Labs Reviewed  BASIC METABOLIC PANEL - Abnormal; Notable for the following components:      Result Value   Sodium 131 (*)    Glucose, Bld 123 (*)    All other components within normal limits  URINALYSIS, COMPLETE (UACMP) WITH MICROSCOPIC - Abnormal; Notable for the following components:   Color, Urine COLORLESS (*)    APPearance CLEAR (*)    Specific Gravity, Urine 1.002 (*)    All other components within normal limits  TSH - Abnormal; Notable for the following components:   TSH 6.208 (*)    All other components within normal limits  CBC  T4, FREE  POC URINE PREG, ED  POCT PREGNANCY, URINE  CBG MONITORING, ED   ____________________________________________   EKG  Interpreted by me Normal sinus rhythm rate of 70.  Normal axis and intervals.  Normal QRS ST segments and T waves.  ____________________________________________    RADIOLOGY  No results found.  ____________________________________________   PROCEDURES Procedures  ____________________________________________    CLINICAL IMPRESSION / ASSESSMENT AND PLAN / ED  COURSE  Medications ordered in the ED: Medications - No data to display  Pertinent labs & imaging results that were available during my care of the patient were reviewed by me and considered in my medical decision making (see chart for details).  Tiffany Bradford was evaluated in Emergency Department on 11/29/2019 for the symptoms described in the history of present illness. She was evaluated in the context of the global COVID-19 pandemic, which necessitated consideration that the patient might be at risk for infection with the SARS-CoV-2 virus that causes COVID-19. Institutional protocols and algorithms that pertain to the evaluation of patients at risk for COVID-19 are in a state of rapid change based on information released by regulatory bodies including the CDC and federal and state organizations. These policies and algorithms were followed during the patient's care in the ED.   Patient presents with paresthesias not in an anatomic distribution.  Doubt stroke or ICH, intracranial tumor, intracranial hypertension.  Very consistent with anxiety especially given her past history and rapid resolution with Klonopin.  I have offered to represcribe a low-dose SSRI which she is amenable to.  She has an appoint with primary care in 5 days.  I will start her on Paxil 10 mg daily which can be reassessed when she follows up with her doctor.      ____________________________________________   FINAL CLINICAL IMPRESSION(S) / ED DIAGNOSES    Final diagnoses:  Paresthesia  Anxiety     ED Discharge Orders         Ordered    PARoxetine (PAXIL) 10 MG tablet     11/29/19 0112          Portions of this note were generated with dragon dictation software. Dictation errors may occur despite best attempts at proofreading.   Carrie Mew, MD 11/29/19 [phone]

## 2019-12-03 DIAGNOSIS — F410 Panic disorder [episodic paroxysmal anxiety] without agoraphobia: Secondary | ICD-10-CM | POA: Diagnosis not present

## 2019-12-03 DIAGNOSIS — F411 Generalized anxiety disorder: Secondary | ICD-10-CM | POA: Diagnosis not present

## 2019-12-06 ENCOUNTER — Other Ambulatory Visit: Payer: Self-pay | Admitting: Internal Medicine

## 2019-12-06 ENCOUNTER — Other Ambulatory Visit: Payer: Self-pay | Admitting: Cardiology

## 2019-12-06 DIAGNOSIS — I493 Ventricular premature depolarization: Secondary | ICD-10-CM

## 2019-12-06 DIAGNOSIS — E039 Hypothyroidism, unspecified: Secondary | ICD-10-CM | POA: Diagnosis not present

## 2019-12-06 DIAGNOSIS — R002 Palpitations: Secondary | ICD-10-CM | POA: Diagnosis not present

## 2019-12-10 ENCOUNTER — Ambulatory Visit: Payer: 59 | Admitting: Nurse Practitioner

## 2019-12-17 ENCOUNTER — Other Ambulatory Visit: Payer: Self-pay

## 2019-12-17 ENCOUNTER — Ambulatory Visit
Admission: RE | Admit: 2019-12-17 | Discharge: 2019-12-17 | Disposition: A | Discharge status: Home / Self Care | Payer: 59 | Source: Ambulatory Visit | Attending: Cardiology | Admitting: Cardiology

## 2019-12-17 DIAGNOSIS — E039 Hypothyroidism, unspecified: Secondary | ICD-10-CM | POA: Diagnosis not present

## 2019-12-17 DIAGNOSIS — I493 Ventricular premature depolarization: Secondary | ICD-10-CM | POA: Diagnosis not present

## 2019-12-17 DIAGNOSIS — I10 Essential (primary) hypertension: Secondary | ICD-10-CM | POA: Insufficient documentation

## 2019-12-17 NOTE — Progress Notes (Signed)
*  PRELIMINARY RESULTS* Echocardiogram 2D Echocardiogram has been performed.  Cristela Blue 12/17/2019, 9:42 AM

## 2019-12-24 DIAGNOSIS — N390 Urinary tract infection, site not specified: Secondary | ICD-10-CM | POA: Diagnosis not present

## 2019-12-24 DIAGNOSIS — F411 Generalized anxiety disorder: Secondary | ICD-10-CM | POA: Diagnosis not present

## 2020-01-19 ENCOUNTER — Other Ambulatory Visit: Payer: Self-pay | Admitting: Family Medicine

## 2020-02-02 ENCOUNTER — Other Ambulatory Visit: Payer: Self-pay | Admitting: Family Medicine

## 2020-02-02 DIAGNOSIS — Z1231 Encounter for screening mammogram for malignant neoplasm of breast: Secondary | ICD-10-CM

## 2020-02-04 DIAGNOSIS — G500 Trigeminal neuralgia: Secondary | ICD-10-CM | POA: Diagnosis not present

## 2020-02-16 DIAGNOSIS — U071 COVID-19: Secondary | ICD-10-CM | POA: Diagnosis not present

## 2020-02-25 DIAGNOSIS — J019 Acute sinusitis, unspecified: Secondary | ICD-10-CM | POA: Diagnosis not present

## 2020-02-25 DIAGNOSIS — U071 COVID-19: Secondary | ICD-10-CM | POA: Diagnosis not present

## 2020-03-03 DIAGNOSIS — U071 COVID-19: Secondary | ICD-10-CM | POA: Diagnosis not present

## 2020-03-08 DIAGNOSIS — I493 Ventricular premature depolarization: Secondary | ICD-10-CM | POA: Diagnosis not present

## 2020-03-08 DIAGNOSIS — R002 Palpitations: Secondary | ICD-10-CM | POA: Diagnosis not present

## 2020-03-08 DIAGNOSIS — F419 Anxiety disorder, unspecified: Secondary | ICD-10-CM | POA: Diagnosis not present

## 2020-03-08 DIAGNOSIS — I10 Essential (primary) hypertension: Secondary | ICD-10-CM | POA: Diagnosis not present

## 2020-04-18 ENCOUNTER — Ambulatory Visit: Payer: 59 | Attending: Internal Medicine

## 2020-04-19 ENCOUNTER — Other Ambulatory Visit: Payer: Self-pay | Admitting: Physician Assistant

## 2020-04-19 DIAGNOSIS — Z23 Encounter for immunization: Secondary | ICD-10-CM | POA: Diagnosis not present

## 2020-04-19 DIAGNOSIS — R002 Palpitations: Secondary | ICD-10-CM | POA: Diagnosis not present

## 2020-04-19 DIAGNOSIS — I493 Ventricular premature depolarization: Secondary | ICD-10-CM | POA: Diagnosis not present

## 2020-04-19 DIAGNOSIS — F419 Anxiety disorder, unspecified: Secondary | ICD-10-CM | POA: Diagnosis not present

## 2020-05-15 ENCOUNTER — Other Ambulatory Visit: Payer: Self-pay | Admitting: Cardiology

## 2020-05-25 DIAGNOSIS — R002 Palpitations: Secondary | ICD-10-CM | POA: Diagnosis not present

## 2020-05-25 DIAGNOSIS — Z23 Encounter for immunization: Secondary | ICD-10-CM | POA: Diagnosis not present

## 2020-05-25 DIAGNOSIS — I10 Essential (primary) hypertension: Secondary | ICD-10-CM | POA: Diagnosis not present

## 2020-05-25 DIAGNOSIS — I493 Ventricular premature depolarization: Secondary | ICD-10-CM | POA: Diagnosis not present

## 2020-06-02 DIAGNOSIS — I10 Essential (primary) hypertension: Secondary | ICD-10-CM | POA: Diagnosis not present

## 2020-06-02 DIAGNOSIS — R002 Palpitations: Secondary | ICD-10-CM | POA: Diagnosis not present

## 2020-06-02 DIAGNOSIS — G500 Trigeminal neuralgia: Secondary | ICD-10-CM | POA: Diagnosis not present

## 2020-06-02 DIAGNOSIS — Z23 Encounter for immunization: Secondary | ICD-10-CM | POA: Diagnosis not present

## 2020-07-17 ENCOUNTER — Other Ambulatory Visit: Payer: Self-pay | Admitting: Student

## 2020-07-17 DIAGNOSIS — R8271 Bacteriuria: Secondary | ICD-10-CM | POA: Diagnosis not present

## 2020-07-17 DIAGNOSIS — M545 Low back pain, unspecified: Secondary | ICD-10-CM | POA: Diagnosis not present

## 2020-09-07 ENCOUNTER — Other Ambulatory Visit: Payer: Self-pay

## 2020-09-07 DIAGNOSIS — G8929 Other chronic pain: Secondary | ICD-10-CM | POA: Diagnosis not present

## 2020-09-07 DIAGNOSIS — R200 Anesthesia of skin: Secondary | ICD-10-CM | POA: Diagnosis not present

## 2020-09-07 DIAGNOSIS — R432 Parageusia: Secondary | ICD-10-CM | POA: Diagnosis not present

## 2020-09-07 DIAGNOSIS — R519 Headache, unspecified: Secondary | ICD-10-CM | POA: Diagnosis not present

## 2020-09-07 DIAGNOSIS — R202 Paresthesia of skin: Secondary | ICD-10-CM | POA: Diagnosis not present

## 2020-10-11 ENCOUNTER — Other Ambulatory Visit: Payer: Self-pay

## 2020-10-11 MED FILL — Levothyroxine Sodium Tab 88 MCG: ORAL | 90 days supply | Qty: 90 | Fill #0 | Status: AC

## 2020-10-13 ENCOUNTER — Other Ambulatory Visit: Payer: Self-pay

## 2020-10-13 DIAGNOSIS — R87610 Atypical squamous cells of undetermined significance on cytologic smear of cervix (ASC-US): Secondary | ICD-10-CM | POA: Diagnosis not present

## 2020-10-13 DIAGNOSIS — Z0000 Encounter for general adult medical examination without abnormal findings: Secondary | ICD-10-CM | POA: Diagnosis not present

## 2020-10-13 MED ORDER — LEVOTHYROXINE SODIUM 88 MCG PO TABS
ORAL_TABLET | ORAL | 2 refills | Status: DC
  Filled 2020-10-13 – 2021-02-15 (×2): qty 90, 90d supply, fill #0
  Filled 2021-07-20: qty 90, 90d supply, fill #1

## 2020-10-18 ENCOUNTER — Other Ambulatory Visit: Payer: Self-pay

## 2020-10-20 ENCOUNTER — Other Ambulatory Visit: Payer: Self-pay

## 2020-10-20 MED ORDER — CLONAZEPAM 0.5 MG PO TABS
ORAL_TABLET | ORAL | 2 refills | Status: DC
  Filled 2020-10-20: qty 30, 30d supply, fill #0
  Filled 2020-12-04: qty 30, 30d supply, fill #1
  Filled 2021-01-10: qty 30, 30d supply, fill #2

## 2020-11-02 ENCOUNTER — Other Ambulatory Visit: Payer: Self-pay

## 2020-11-02 MED FILL — Metoprolol Tartrate Tab 25 MG: ORAL | 30 days supply | Qty: 90 | Fill #0 | Status: AC

## 2020-11-24 ENCOUNTER — Encounter: Payer: 59 | Admitting: Obstetrics and Gynecology

## 2020-12-04 ENCOUNTER — Other Ambulatory Visit: Payer: Self-pay

## 2020-12-07 ENCOUNTER — Encounter: Payer: 59 | Admitting: Obstetrics and Gynecology

## 2020-12-08 ENCOUNTER — Other Ambulatory Visit: Payer: Self-pay

## 2020-12-08 ENCOUNTER — Encounter: Payer: Self-pay | Admitting: Obstetrics and Gynecology

## 2020-12-08 MED ORDER — CARESTART COVID-19 HOME TEST VI KIT
PACK | 0 refills | Status: AC
  Filled 2020-12-08: qty 2, 4d supply, fill #0

## 2020-12-18 ENCOUNTER — Other Ambulatory Visit: Payer: Self-pay

## 2020-12-18 MED FILL — Metoprolol Tartrate Tab 25 MG: ORAL | 30 days supply | Qty: 90 | Fill #1 | Status: AC

## 2020-12-26 ENCOUNTER — Encounter: Payer: Self-pay | Admitting: Obstetrics and Gynecology

## 2021-01-02 ENCOUNTER — Ambulatory Visit (INDEPENDENT_AMBULATORY_CARE_PROVIDER_SITE_OTHER): Payer: 59 | Admitting: Obstetrics and Gynecology

## 2021-01-02 ENCOUNTER — Other Ambulatory Visit: Payer: Self-pay

## 2021-01-02 ENCOUNTER — Other Ambulatory Visit (HOSPITAL_COMMUNITY)
Admission: RE | Admit: 2021-01-02 | Discharge: 2021-01-02 | Disposition: A | Discharge status: Home / Self Care | Payer: 59 | Source: Ambulatory Visit | Attending: Obstetrics and Gynecology | Admitting: Obstetrics and Gynecology

## 2021-01-02 ENCOUNTER — Encounter: Payer: Self-pay | Admitting: Obstetrics and Gynecology

## 2021-01-02 VITALS — BP 145/77 | HR 73 | Ht 61.0 in | Wt 124.4 lb

## 2021-01-02 DIAGNOSIS — Z9889 Other specified postprocedural states: Secondary | ICD-10-CM

## 2021-01-02 DIAGNOSIS — N72 Inflammatory disease of cervix uteri: Secondary | ICD-10-CM | POA: Diagnosis not present

## 2021-01-02 DIAGNOSIS — B977 Papillomavirus as the cause of diseases classified elsewhere: Secondary | ICD-10-CM

## 2021-01-02 DIAGNOSIS — R87610 Atypical squamous cells of undetermined significance on cytologic smear of cervix (ASC-US): Secondary | ICD-10-CM | POA: Diagnosis not present

## 2021-01-02 DIAGNOSIS — N870 Mild cervical dysplasia: Secondary | ICD-10-CM | POA: Diagnosis not present

## 2021-01-02 NOTE — Addendum Note (Signed)
Addended by: Dorian Pod on: 01/02/2021 03:28 PM   Modules accepted: Orders

## 2021-01-02 NOTE — Progress Notes (Signed)
Referring Provider: Phineas Real  HPI:  Tiffany Bradford is a 50 y.o.  [phone]  who presents today for evaluation and management of abnormal cervical cytology.    Dysplasia History: Most recent Pap showed ASCUS-positive HPV     Patient has a history of LEEP for CIN-2 more than 3 years ago.  She says her first 2 Pap smears after that were normal. ROS:  Pertinent items noted in HPI and remainder of comprehensive ROS otherwise negative.  OB History  Gravida Para Term Preterm AB Living  5 3 3   2 3   SAB IAB Ectopic Multiple Live Births  2       3    # Outcome Date GA Lbr Len/2nd Weight Sex Delivery Anes PTL Lv  5 SAB           4 SAB           3 Term     M Vag-Spont  N LIV  2 Term     M Vag-Spont  N LIV  1 Term     F Vag-Spont  N LIV    Past Medical History:  Diagnosis Date   Abnormal Pap smear of cervix    Anxiety    Gestational diabetes    Hypertension    Hypothyroidism    Pre-eclampsia    Tension headache    TMJ (dislocation of temporomandibular joint)     History reviewed. No pertinent surgical history.  SOCIAL HISTORY:  Social History   Substance and Sexual Activity  Alcohol Use No    Social History   Substance and Sexual Activity  Drug Use No     History reviewed. No pertinent family history.  ALLERGIES:  Penicillins  She has a current medication list which includes the following prescription(s): clonazepam, clonazepam, carestart covid-19 home test, levothyroxine, levothyroxine, levothyroxine, lisinopril, metoprolol tartrate, clonazepam, cyclobenzaprine, cyclobenzaprine, escitalopram, escitalopram, gabapentin, gabapentin, hydrocodone-acetaminophen, ibuprofen, levofloxacin, naproxen, paroxetine, potassium chloride, and tizanidine.  Physical Exam: -Vitals:  BP (!) 145/77   Pulse 73   Ht 5\' 1"  (1.549 m)   Wt 124 lb 6.4 oz (56.4 kg)   BMI 23.51 kg/m   PROCEDURE: Colposcopy performed with 4% acetic acid after verbal consent obtained                            -Aceto-white Lesions Location(s): 12 o'clock.              -Biopsy performed at 12 o'clock               -ECC indicated and performed: No.     -Biopsy sites made hemostatic with pressure and Monsel's solution   -Satisfactory colposcopy: Yes.      -Evidence of Invasive cervical CA :  NO  ASSESSMENT:  Tiffany Bradford is a 50 y.o. [phone] here for  1. Atypical squamous cells of undetermined significance on cytologic smear of cervix (ASC-US)   2. High risk human papilloma virus (HPV) infection of cervix   3. History of loop electrical excision procedure (LEEP)   .  PLAN: 1.  I discussed the grading system of pap smears and HPV high risk viral types.  We will discuss management after colpo results return.  No orders of the defined types were placed in this encounter.          F/U  Return in about 2 weeks (around 01/16/2021) for Colpo f/u.  [id number] ,MD 01/02/2021,3:12 PM

## 2021-01-04 LAB — SURGICAL PATHOLOGY

## 2021-01-10 ENCOUNTER — Other Ambulatory Visit: Payer: Self-pay

## 2021-01-19 ENCOUNTER — Encounter: Payer: 59 | Admitting: Obstetrics and Gynecology

## 2021-01-25 ENCOUNTER — Other Ambulatory Visit: Payer: Self-pay

## 2021-01-25 ENCOUNTER — Ambulatory Visit (INDEPENDENT_AMBULATORY_CARE_PROVIDER_SITE_OTHER): Payer: 59 | Admitting: Obstetrics and Gynecology

## 2021-01-25 ENCOUNTER — Encounter: Payer: Self-pay | Admitting: Obstetrics and Gynecology

## 2021-01-25 VITALS — BP 130/71 | HR 84 | Ht 61.0 in | Wt 124.0 lb

## 2021-01-25 DIAGNOSIS — N870 Mild cervical dysplasia: Secondary | ICD-10-CM

## 2021-01-25 DIAGNOSIS — Z9889 Other specified postprocedural states: Secondary | ICD-10-CM

## 2021-01-25 NOTE — Progress Notes (Signed)
HPI:      Ms. Tiffany Bradford is a 50 y.o. [id number] who LMP was No LMP recorded. (Menstrual status: Irregular Periods).  Subjective:   She presents today for follow-up after colposcopy.  She has a remote history (3 years ago) of LEEP for CIN-2.  She subsequently had an abnormal Pap this last year and has undergone colposcopy.    Hx: The following portions of the patient's history were reviewed and updated as appropriate:             She  has a past medical history of Abnormal Pap smear of cervix, Anxiety, Gestational diabetes, Hypertension, Hypothyroidism, Pre-eclampsia, Tension headache, and TMJ (dislocation of temporomandibular joint). She does not have any pertinent problems on file. She  has no past surgical history on file. Her family history is not on file. She  reports that she has never smoked. She has never used smokeless tobacco. She reports that she does not drink alcohol and does not use drugs. She has a current medication list which includes the following prescription(s): clonazepam, clonazepam, clonazepam, carestart covid-19 home test, cyclobenzaprine, cyclobenzaprine, escitalopram, escitalopram, gabapentin, gabapentin, hydrocodone-acetaminophen, ibuprofen, levofloxacin, levothyroxine, levothyroxine, levothyroxine, lisinopril, metoprolol tartrate, naproxen, paroxetine, potassium chloride, and tizanidine. She is allergic to penicillins.       Review of Systems:  Review of Systems  Constitutional: Denied constitutional symptoms, night sweats, recent illness, fatigue, fever, insomnia and weight loss.  Eyes: Denied eye symptoms, eye pain, photophobia, vision change and visual disturbance.  Ears/Nose/Throat/Neck: Denied ear, nose, throat or neck symptoms, hearing loss, nasal discharge, sinus congestion and sore throat.  Cardiovascular: Denied cardiovascular symptoms, arrhythmia, chest pain/pressure, edema, exercise intolerance, orthopnea and palpitations.  Respiratory: Denied  pulmonary symptoms, asthma, pleuritic pain, productive sputum, cough, dyspnea and wheezing.  Gastrointestinal: Denied, gastro-esophageal reflux, melena, nausea and vomiting.  Genitourinary: Denied genitourinary symptoms including symptomatic vaginal discharge, pelvic relaxation issues, and urinary complaints.  Musculoskeletal: Denied musculoskeletal symptoms, stiffness, swelling, muscle weakness and myalgia.  Dermatologic: Denied dermatology symptoms, rash and scar.  Neurologic: Denied neurology symptoms, dizziness, headache, neck pain and syncope.  Psychiatric: Denied psychiatric symptoms, anxiety and depression.  Endocrine: Denied endocrine symptoms including hot flashes and night sweats.   Meds:   Current Outpatient Medications on File Prior to Visit  Medication Sig Dispense Refill   clonazePAM (KLONOPIN) 0.5 MG tablet Take 0.5 mg by mouth 2 (two) times daily as needed for anxiety.     clonazePAM (KLONOPIN) 0.5 MG tablet TOME 1 PASTILLA POR VIA ORAL UNA VEZ AL DIA SEGUN SEA NECESARIO ANXIETY 30 tablet 2   clonazePAM (KLONOPIN) 0.5 MG tablet Tome 1 pastilla por via oral una vez al dia segun sea necesario anxiety 30 tablet 2   COVID-19 At Home Antigen Test (CARESTART COVID-19 HOME TEST) KIT use as directed within package instructions 2 kit 0   cyclobenzaprine (FLEXERIL) 10 MG tablet Take 1 tablet (10 mg total) by mouth at bedtime. Reported on 07/12/2015 (Patient not taking: Reported on 01/02/2021) 30 tablet 0   cyclobenzaprine (FLEXERIL) 5 MG tablet TAKE 1 TABLET BY MOUTH 3 TIMES DAILY AS NEEDED FOR MUSCLE SPASMS (TAKE EVERY EVENING IF CAUSES DROWSINESS) (Patient not taking: Reported on 01/02/2021) 21 tablet 0   escitalopram (LEXAPRO) 5 MG tablet Take 5 mg by mouth.     escitalopram (LEXAPRO) 5 MG tablet  (Patient not taking: Reported on 01/02/2021)  3   gabapentin (NEURONTIN) 100 MG capsule TAKE 1 CAPSULE BY MOUTH NIGHTLY FOR 3 DAYS, THEN 1 CAP TWICE A  DAY FOR 1 WEEK, THEN INCREASE TO 2 CAPSULES  TWICE A DAY (Patient not taking: Reported on 01/02/2021) 120 capsule 3   gabapentin (NEURONTIN) 300 MG capsule Take 1 capsule (300 mg total) by mouth 2 (two) times daily. (Patient not taking: Reported on 01/02/2021) 28 capsule 0   HYDROcodone-acetaminophen (NORCO) 5-325 MG tablet Take 1 tablet by mouth every 6 (six) hours as needed (breakthrough pain). (Patient not taking: Reported on 01/02/2021) 15 tablet 0   ibuprofen (ADVIL,MOTRIN) 600 MG tablet Take 1 tablet (600 mg total) by mouth every 6 (six) hours as needed for mild pain or cramping. (Patient not taking: Reported on 01/02/2021) 30 tablet 0   levofloxacin (LEVAQUIN) 500 MG tablet TAKE 1 TABLET BY MOUTH ONCE DAILY (Patient not taking: Reported on 01/02/2021) 5 tablet 0   levothyroxine (SYNTHROID) 88 MCG tablet TOME 1 TABLETA POR LA BOCA CADA DIA R LOW THYROID 90 tablet 2   levothyroxine (SYNTHROID) 88 MCG tablet TOME 1 TABLETA POR LA BOCA CADA DIA R LOW THYROID 90 tablet 2   levothyroxine (SYNTHROID, LEVOTHROID) 88 MCG tablet   3   lisinopril (PRINIVIL,ZESTRIL) 10 MG tablet   11   metoprolol tartrate (LOPRESSOR) 25 MG tablet TAKE 1 TABLET BY MOUTH IN THE MORNING AND 2 TABLETS AT NIGHT. 90 tablet 11   naproxen (NAPROSYN) 500 MG tablet TAKE 1 TABLET BY MOUTH 2 TIMES DAILY AS NEEDED (Patient not taking: Reported on 01/02/2021) 14 tablet 0   PARoxetine (PAXIL) 10 MG tablet Take 1 tablet (10 mg total) by mouth daily for 7 days, THEN 2 tablets (20 mg total) daily for 8 days. 23 tablet 0   potassium chloride (K-DUR) 10 MEQ tablet Take 1 tablet (10 mEq total) by mouth 2 (two) times daily. (Patient not taking: Reported on 01/02/2021) 20 tablet 0   tiZANidine (ZANAFLEX) 4 MG tablet Take 3 pills at night     No current facility-administered medications on file prior to visit.      Objective:     Vitals:   01/25/21 1535  BP: 130/71  Pulse: 84   Filed Weights   01/25/21 1535  Weight: 124 lb (56.2 kg)              Colposcopically directed biopsies  revealed CIN-1  Assessment:    [id number] 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   Headache 06/02/2014   Acute onset aura migraine 05/25/2014   Adult hypothyroidism 05/25/2014   ANA positive 04/06/2014   De Quervain's disease (radial styloid tenosynovitis) 04/06/2014     1. CIN I (cervical intraepithelial neoplasia I)   2. History of loop electrical excision procedure (LEEP)     Recurrent CIN-1 after LEEP for CIN-2.   Plan:            1.  We have discussed this recurrence in detail.  I recommended a follow-up Pap smear with Pap Co-test in 1 year.  Should that be abnormal would consider re colposcopy. Patient states she gets her Pap smears at [facility]. Orders No orders of the defined types were placed in this encounter.   No orders of the defined types were placed in this encounter.     F/U  No follow-ups on file. I spent 22 minutes  involved in the care of this patient preparing to see the patient by obtaining and reviewing her medical history (including labs, imaging tests and prior procedures), documenting clinical information in the electronic health record (EHR), counseling and coordinating care plans, writing and sending prescriptions, ordering tests or procedures and in direct communicating with the patient and medical staff discussing pertinent items from her history and physical exam.  Tiffany Bradford, M.D. 01/25/2021 3:53 PM

## 2021-01-26 ENCOUNTER — Other Ambulatory Visit: Payer: Self-pay

## 2021-01-26 MED FILL — Metoprolol Tartrate Tab 25 MG: ORAL | 30 days supply | Qty: 90 | Fill #2 | Status: AC

## 2021-01-29 ENCOUNTER — Encounter: Payer: Self-pay | Admitting: Obstetrics and Gynecology

## 2021-01-29 ENCOUNTER — Other Ambulatory Visit: Payer: Self-pay

## 2021-02-15 ENCOUNTER — Other Ambulatory Visit: Payer: Self-pay

## 2021-02-23 ENCOUNTER — Other Ambulatory Visit: Payer: Self-pay

## 2021-02-23 MED ORDER — CLONAZEPAM 0.5 MG PO TABS
ORAL_TABLET | ORAL | 2 refills | Status: DC
  Filled 2021-02-23: qty 30, 30d supply, fill #0
  Filled 2021-04-10: qty 30, 30d supply, fill #1
  Filled 2021-05-21: qty 30, 30d supply, fill #2

## 2021-02-27 ENCOUNTER — Other Ambulatory Visit: Payer: Self-pay

## 2021-03-16 ENCOUNTER — Other Ambulatory Visit: Payer: Self-pay

## 2021-03-16 MED FILL — Metoprolol Tartrate Tab 25 MG: ORAL | 30 days supply | Qty: 90 | Fill #3 | Status: AC

## 2021-03-23 DIAGNOSIS — R351 Nocturia: Secondary | ICD-10-CM | POA: Diagnosis not present

## 2021-03-29 ENCOUNTER — Other Ambulatory Visit: Payer: Self-pay

## 2021-03-30 DIAGNOSIS — R351 Nocturia: Secondary | ICD-10-CM | POA: Diagnosis not present

## 2021-03-30 DIAGNOSIS — R634 Abnormal weight loss: Secondary | ICD-10-CM | POA: Diagnosis not present

## 2021-04-10 ENCOUNTER — Other Ambulatory Visit: Payer: Self-pay

## 2021-04-27 ENCOUNTER — Other Ambulatory Visit: Payer: Self-pay

## 2021-04-27 DIAGNOSIS — Z0130 Encounter for examination of blood pressure without abnormal findings: Secondary | ICD-10-CM | POA: Diagnosis not present

## 2021-04-27 DIAGNOSIS — Z0000 Encounter for general adult medical examination without abnormal findings: Secondary | ICD-10-CM | POA: Diagnosis not present

## 2021-04-27 DIAGNOSIS — R634 Abnormal weight loss: Secondary | ICD-10-CM | POA: Diagnosis not present

## 2021-04-27 DIAGNOSIS — Z1389 Encounter for screening for other disorder: Secondary | ICD-10-CM | POA: Diagnosis not present

## 2021-04-27 MED ORDER — CITALOPRAM HYDROBROMIDE 10 MG PO TABS
ORAL_TABLET | ORAL | 3 refills | Status: AC
  Filled 2021-04-27: qty 90, 90d supply, fill #0

## 2021-04-30 ENCOUNTER — Other Ambulatory Visit: Payer: Self-pay

## 2021-04-30 MED ORDER — METOPROLOL TARTRATE 25 MG PO TABS
ORAL_TABLET | ORAL | 3 refills | Status: DC
  Filled 2021-04-30: qty 90, 36d supply, fill #0
  Filled 2021-06-11: qty 90, 36d supply, fill #1
  Filled 2021-07-20: qty 90, 36d supply, fill #2
  Filled 2021-09-13: qty 90, 36d supply, fill #3

## 2021-05-07 ENCOUNTER — Other Ambulatory Visit: Payer: Self-pay

## 2021-05-07 MED ORDER — CARESTART COVID-19 HOME TEST VI KIT
PACK | 0 refills | Status: AC
  Filled 2021-05-07: qty 2, 4d supply, fill #0

## 2021-05-17 DIAGNOSIS — J101 Influenza due to other identified influenza virus with other respiratory manifestations: Secondary | ICD-10-CM | POA: Diagnosis not present

## 2021-05-17 DIAGNOSIS — R0981 Nasal congestion: Secondary | ICD-10-CM | POA: Diagnosis not present

## 2021-05-17 DIAGNOSIS — R067 Sneezing: Secondary | ICD-10-CM | POA: Diagnosis not present

## 2021-05-17 DIAGNOSIS — R519 Headache, unspecified: Secondary | ICD-10-CM | POA: Diagnosis not present

## 2021-05-17 DIAGNOSIS — Z20822 Contact with and (suspected) exposure to covid-19: Secondary | ICD-10-CM | POA: Diagnosis not present

## 2021-05-17 DIAGNOSIS — R509 Fever, unspecified: Secondary | ICD-10-CM | POA: Diagnosis not present

## 2021-05-21 ENCOUNTER — Other Ambulatory Visit: Payer: Self-pay

## 2021-06-09 ENCOUNTER — Emergency Department: Payer: 59

## 2021-06-09 ENCOUNTER — Encounter: Payer: Self-pay | Admitting: Emergency Medicine

## 2021-06-09 ENCOUNTER — Emergency Department
Admission: EM | Admit: 2021-06-09 | Discharge: 2021-06-09 | Disposition: A | Discharge status: Home / Self Care | Payer: 59 | Attending: Emergency Medicine | Admitting: Emergency Medicine

## 2021-06-09 ENCOUNTER — Other Ambulatory Visit: Payer: Self-pay

## 2021-06-09 DIAGNOSIS — R519 Headache, unspecified: Secondary | ICD-10-CM | POA: Diagnosis not present

## 2021-06-09 DIAGNOSIS — I10 Essential (primary) hypertension: Secondary | ICD-10-CM | POA: Diagnosis not present

## 2021-06-09 DIAGNOSIS — Z79899 Other long term (current) drug therapy: Secondary | ICD-10-CM | POA: Diagnosis not present

## 2021-06-09 DIAGNOSIS — E039 Hypothyroidism, unspecified: Secondary | ICD-10-CM | POA: Insufficient documentation

## 2021-06-09 LAB — URINALYSIS, COMPLETE (UACMP) WITH MICROSCOPIC
Bacteria, UA: NONE SEEN
Bilirubin Urine: NEGATIVE
Glucose, UA: NEGATIVE mg/dL
Hgb urine dipstick: NEGATIVE
Ketones, ur: NEGATIVE mg/dL
Leukocytes,Ua: NEGATIVE
Nitrite: NEGATIVE
Protein, ur: NEGATIVE mg/dL
Specific Gravity, Urine: 1.001 — ABNORMAL LOW (ref 1.005–1.030)
Squamous Epithelial / HPF: NONE SEEN (ref 0–5)
pH: 6 (ref 5.0–8.0)

## 2021-06-09 LAB — BASIC METABOLIC PANEL
Anion gap: 4 — ABNORMAL LOW (ref 5–15)
BUN: 12 mg/dL (ref 6–20)
CO2: 20 mmol/L — ABNORMAL LOW (ref 22–32)
Calcium: 8.5 mg/dL — ABNORMAL LOW (ref 8.9–10.3)
Chloride: 110 mmol/L (ref 98–111)
Creatinine, Ser: 0.4 mg/dL — ABNORMAL LOW (ref 0.44–1.00)
GFR, Estimated: >60 mL/min (ref >60)
Glucose, Bld: 90 mg/dL (ref 70–99)
Potassium: 3.4 mmol/L — ABNORMAL LOW (ref 3.5–5.1)
Sodium: 134 mmol/L — ABNORMAL LOW (ref 135–145)

## 2021-06-09 LAB — COMPREHENSIVE METABOLIC PANEL
ALT: 21 U/L (ref 0–44)
AST: 25 U/L (ref 15–41)
Albumin: 4.1 g/dL (ref 3.5–5.0)
Alkaline Phosphatase: 99 U/L (ref 38–126)
Anion gap: 4 — ABNORMAL LOW (ref 5–15)
BUN: 13 mg/dL (ref 6–20)
CO2: 24 mmol/L (ref 22–32)
Calcium: 9.2 mg/dL (ref 8.9–10.3)
Chloride: 98 mmol/L (ref 98–111)
Creatinine, Ser: 0.49 mg/dL (ref 0.44–1.00)
GFR, Estimated: >60 mL/min (ref >60)
Glucose, Bld: 105 mg/dL — ABNORMAL HIGH (ref 70–99)
Potassium: 3.4 mmol/L — ABNORMAL LOW (ref 3.5–5.1)
Sodium: 126 mmol/L — ABNORMAL LOW (ref 135–145)
Total Bilirubin: 0.7 mg/dL (ref 0.3–1.2)
Total Protein: 7.8 g/dL (ref 6.5–8.1)

## 2021-06-09 LAB — C-REACTIVE PROTEIN: CRP: 0.5 mg/dL (ref <1.0)

## 2021-06-09 LAB — CBC WITH DIFFERENTIAL/PLATELET
Abs Immature Granulocytes: 0.02 10*3/uL (ref 0.00–0.07)
Basophils Absolute: 0.1 10*3/uL (ref 0.0–0.1)
Basophils Relative: 1 %
Eosinophils Absolute: 0.1 10*3/uL (ref 0.0–0.5)
Eosinophils Relative: 1 %
HCT: 33.3 % — ABNORMAL LOW (ref 36.0–46.0)
Hemoglobin: 11.4 g/dL — ABNORMAL LOW (ref 12.0–15.0)
Immature Granulocytes: 0 %
Lymphocytes Relative: 32 %
Lymphs Abs: 3.1 10*3/uL (ref 0.7–4.0)
MCH: 32 pg (ref 26.0–34.0)
MCHC: 34.2 g/dL (ref 30.0–36.0)
MCV: 93.5 fL (ref 80.0–100.0)
Monocytes Absolute: 0.7 10*3/uL (ref 0.1–1.0)
Monocytes Relative: 7 %
Neutro Abs: 5.6 10*3/uL (ref 1.7–7.7)
Neutrophils Relative %: 59 %
Platelets: 216 10*3/uL (ref 150–400)
RBC: 3.56 MIL/uL — ABNORMAL LOW (ref 3.87–5.11)
RDW: 12.1 % (ref 11.5–15.5)
WBC: 9.6 10*3/uL (ref 4.0–10.5)
nRBC: 0 % (ref 0.0–0.2)

## 2021-06-09 LAB — SEDIMENTATION RATE: Sed Rate: 21 mm/hr (ref 0–30)

## 2021-06-09 MED ORDER — METOCLOPRAMIDE HCL 5 MG/ML IJ SOLN
10.0000 mg | Freq: Once | INTRAMUSCULAR | Status: AC
  Administered 2021-06-09: 10 mg via INTRAVENOUS
  Filled 2021-06-09: qty 2

## 2021-06-09 MED ORDER — DIPHENHYDRAMINE HCL 50 MG/ML IJ SOLN
50.0000 mg | Freq: Once | INTRAMUSCULAR | Status: AC
  Administered 2021-06-09: 50 mg via INTRAVENOUS
  Filled 2021-06-09: qty 1

## 2021-06-09 MED ORDER — KETOROLAC TROMETHAMINE 30 MG/ML IJ SOLN
30.0000 mg | Freq: Once | INTRAMUSCULAR | Status: AC
  Administered 2021-06-09: 30 mg via INTRAVENOUS
  Filled 2021-06-09: qty 1

## 2021-06-09 MED ORDER — SODIUM CHLORIDE 0.9 % IV BOLUS
1000.0000 mL | Freq: Once | INTRAVENOUS | Status: AC
  Administered 2021-06-09: 1000 mL via INTRAVENOUS

## 2021-06-09 NOTE — ED Triage Notes (Signed)
Pt reports started with a HA this am to the left side of her head and now her left eye is hurting. Pt reports has not taken any meds for the HA

## 2021-06-09 NOTE — ED Provider Notes (Signed)
[facility]-EMERGENCY DEPARTMENT  ____________________________________________  Time seen: Approximately 3:33 PM  I have reviewed the triage vital signs and the nursing notes.   HISTORY  Chief Complaint Headache   Historian Patient    HPI Tiffany Bradford is a 50 y.o. female presents to the emergency department with a 10 out of 10, stabbing left-sided headache.  Patient states that she had a prodrome of left eye pain that started last night.  Patient states that she has had headaches in the past but is never had a headache like this.  She states that she had both COVID-19 and influenza around Thanksgiving time and seems to recover without complication.  She denies falls or mechanisms of trauma.  She has not started any new medications.  She denies chest pain, chest tightness or abdominal pain.   Past Medical History:  Diagnosis Date   Abnormal Pap smear of cervix    Anxiety    Gestational diabetes    Hypertension    Hypothyroidism    Pre-eclampsia    Tension headache    TMJ (dislocation of temporomandibular joint)      Immunizations up to date:  Yes.     Past Medical History:  Diagnosis Date   Abnormal Pap smear of cervix    Anxiety    Gestational diabetes    Hypertension    Hypothyroidism    Pre-eclampsia    Tension headache    TMJ (dislocation of temporomandibular joint)     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   Headache 06/02/2014   Acute onset aura migraine 05/25/2014   Adult hypothyroidism 05/25/2014   ANA positive 04/06/2014   De Quervain's disease (radial styloid tenosynovitis) 04/06/2014    History reviewed. No pertinent surgical history.  Prior to Admission medications    Medication Sig Start Date End Date Taking? Authorizing Provider  citalopram (CELEXA) 10 MG tablet 1 by mouth daily for anxiety 04/27/21     clonazePAM (KLONOPIN) 0.5 MG tablet Take 0.5 mg by mouth 2 (two) times daily as needed for anxiety.    [provider]  clonazePAM (KLONOPIN) 0.5 MG tablet TOME 1 PASTILLA POR VIA ORAL UNA VEZ AL DIA SEGUN SEA NECESARIO ANXIETY 05/15/20 11/11/20  Paraschos, Alexander, MD  clonazePAM (KLONOPIN) 0.5 MG tablet Tome 1 pastilla por via oral una vez al dia segun sea necesario anxiety 02/23/21     COVID-19 At Home Antigen Test [facility] COVID-19 HOME TEST) KIT use as directed within package instructions 12/08/20   Letta Median, [facility]  COVID-19 At [facility] Antigen Test [facility] COVID-19 HOME TEST) KIT Use as directed 05/07/21   Letta Median, RPH  cyclobenzaprine (FLEXERIL) 10 MG tablet Take 1 tablet (10 mg total) by mouth at bedtime. Reported on 07/12/2015 Patient not taking: Reported on 01/02/2021 05/24/16   Versie Starks, PA-C  cyclobenzaprine (FLEXERIL) 5 MG tablet TAKE 1 TABLET BY MOUTH 3 TIMES DAILY AS NEEDED FOR MUSCLE SPASMS (TAKE EVERY EVENING IF CAUSES DROWSINESS) Patient not taking: Reported on 01/02/2021 07/17/20 07/17/21  Frederica Kuster, PA-C  escitalopram (LEXAPRO) 5 MG tablet Take 5 mg by mouth. 12/29/15 12/28/16  [provider]  escitalopram (LEXAPRO) 5 MG tablet  01/03/17   [provider]  gabapentin (NEURONTIN) 100 MG  capsule TAKE 1 CAPSULE BY MOUTH NIGHTLY FOR 3 DAYS, THEN 1 CAP TWICE A DAY FOR 1 WEEK, THEN INCREASE TO 2 CAPSULES TWICE A DAY Patient not taking: Reported on 01/02/2021 09/07/20 09/07/21    gabapentin (NEURONTIN) 300 MG capsule Take 1 capsule (300 mg total) by mouth 2 (two) times daily. Patient not taking: Reported on 01/02/2021 06/04/15   Delman Kitten, MD  HYDROcodone-acetaminophen (NORCO) 5-325 MG tablet Take 1 tablet by mouth every 6 (six) hours as needed (breakthrough pain). Patient not taking: Reported on 01/02/2021  12/11/16   Will Bonnet, MD  ibuprofen (ADVIL,MOTRIN) 600 MG tablet Take 1 tablet (600 mg total) by mouth every 6 (six) hours as needed for mild pain or cramping. Patient not taking: Reported on 01/02/2021 12/11/16   Will Bonnet, MD  levofloxacin (LEVAQUIN) 500 MG tablet TAKE 1 TABLET BY MOUTH ONCE DAILY Patient not taking: Reported on 01/02/2021 07/17/20 07/17/21  Frederica Kuster, PA-C  levothyroxine (SYNTHROID) 88 MCG tablet TOME 1 TABLETA POR LA BOCA CADA DIA R LOW THYROID 01/19/20   Elba Barman, MD  levothyroxine (SYNTHROID) 88 MCG tablet TOME 1 TABLETA POR LA BOCA CADA DIA R LOW THYROID 10/13/20     levothyroxine (SYNTHROID, LEVOTHROID) 88 MCG tablet  05/17/15   [provider]  lisinopril (PRINIVIL,ZESTRIL) 10 MG tablet  06/02/15   [provider]  metoprolol tartrate (LOPRESSOR) 25 MG tablet TAKE 1 TABLET BY MOUTH IN THE MORNING AND 2 TABLETS AT NIGHT. 04/19/20 04/19/21  Clabe Seal, PA-C  metoprolol tartrate (LOPRESSOR) 25 MG tablet TAKE 1 TABLET BY MOUTH IN THE MORNING AND 1.5 TABLETS AT NIGHT. 04/30/21     naproxen (NAPROSYN) 500 MG tablet TAKE 1 TABLET BY MOUTH 2 TIMES DAILY AS NEEDED Patient not taking: Reported on 01/02/2021 07/17/20 07/17/21  Frederica Kuster, PA-C  PARoxetine (PAXIL) 10 MG tablet Take 1 tablet (10 mg total) by mouth daily for 7 days, THEN 2 tablets (20 mg total) daily for 8 days. 11/29/19 12/14/19  Carrie Mew, MD  potassium chloride (K-DUR) 10 MEQ tablet Take 1 tablet (10 mEq total) by mouth 2 (two) times daily. Patient not taking: Reported on 01/02/2021 11/11/14   Ahmed Prima, MD  tiZANidine (ZANAFLEX) 4 MG tablet Take 3 pills at night 01/03/17   [provider]    Allergies Penicillins  No family history on file.  Social History Social History   Tobacco Use   Smoking status: Never   Smokeless tobacco: Never  Substance Use Topics   Alcohol use: No   Drug use: No     Review of Systems  Constitutional: No  fever/chills Eyes:  No discharge ENT: No upper respiratory complaints. Respiratory: no cough. No SOB/ use of accessory muscles to breath Gastrointestinal:   No nausea, no vomiting.  No diarrhea.  No constipation. Musculoskeletal: Negative for musculoskeletal pain. Neuro: Patient has headache.  Skin: Negative for rash, abrasions, lacerations, ecchymosis.   ____________________________________________   PHYSICAL EXAM:  VITAL SIGNS: ED Triage Vitals  Enc Vitals Group     BP 06/09/21 1442 (!) 157/94     Pulse Rate 06/09/21 1442 70     Resp 06/09/21 1442 20     Temp 06/09/21 1442 97.6 F (36.4 C)     Temp src --      SpO2 06/09/21 1442 100 %     Weight 06/09/21 1440 120 lb (54.4 kg)     Height 06/09/21 1440 _0  (1.549 m)  Head Circumference --      Peak Flow --      Pain Score 06/09/21 1440 9     Pain Loc --      Pain Edu? --      Excl. in [place]? --      Constitutional: Alert and oriented. Well appearing and in no acute distress. Eyes: Conjunctivae are normal. PERRL. EOMI. Head: Atraumatic. ENT:      Nose: No congestion/rhinnorhea.      Mouth/Throat: Mucous membranes are moist.  Neck: No stridor.  No cervical spine tenderness to palpation. Cardiovascular: Normal rate, regular rhythm. Normal S1 and S2.  Good peripheral circulation. Respiratory: Normal respiratory effort without tachypnea or retractions. Lungs CTAB. Good air entry to the bases with no decreased or absent breath sounds Gastrointestinal: Bowel sounds x 4 quadrants. Soft and nontender to palpation. No guarding or rigidity. No distention. Musculoskeletal: Full range of motion to all extremities. No obvious deformities noted Neurologic:  Normal for age. No gross focal neurologic deficits are appreciated.  Skin:  Skin is warm, dry and intact. No rash noted. Psychiatric: Mood and affect are normal for age. Speech and behavior are normal.   ____________________________________________   LABS (all labs ordered  are listed, but only abnormal results are displayed)  Labs Reviewed  CBC WITH DIFFERENTIAL/PLATELET - Abnormal; Notable for the following components:      Result Value   RBC 3.56 (*)    Hemoglobin 11.4 (*)    HCT 33.3 (*)    All other components within normal limits  COMPREHENSIVE METABOLIC PANEL - Abnormal; Notable for the following components:   Sodium 126 (*)    Potassium 3.4 (*)    Glucose, Bld 105 (*)    Anion gap 4 (*)    All other components within normal limits  URINALYSIS, COMPLETE (UACMP) WITH MICROSCOPIC - Abnormal; Notable for the following components:   Color, Urine COLORLESS (*)    APPearance CLEAR (*)    Specific Gravity, Urine 1.001 (*)    All other components within normal limits  BASIC METABOLIC PANEL - Abnormal; Notable for the following components:   Sodium 134 (*)    Potassium 3.4 (*)    CO2 20 (*)    Creatinine, Ser 0.40 (*)    Calcium 8.5 (*)    Anion gap 4 (*)    All other components within normal limits  SEDIMENTATION RATE  C-REACTIVE PROTEIN   ____________________________________________  EKG   ____________________________________________  RADIOLOGY Unk Pinto, personally viewed and evaluated these images (plain radiographs) as part of my medical decision making, as well as reviewing the written report by the radiologist.    DG Chest 2 View  Result Date: 06/09/2021 CLINICAL DATA:  Headache. EXAM: CHEST - 2 VIEW COMPARISON:  June 09, 2021 FINDINGS: The heart size and mediastinal contours are within normal limits. Radiopaque nipple markers seen. A 3 mm calcified granuloma is noted aspect of the right upper lobe. Both lungs are otherwise clear. The visualized skeletal structures are unremarkable. IMPRESSION: No active cardiopulmonary disease. Electronically Signed   By: Virgina Norfolk M.D.   On: 06/09/2021 19:17   DG Chest 2 View  Result Date: 06/09/2021 CLINICAL DATA:  Hypertension. EXAM: CHEST - 2 VIEW COMPARISON:  Chest  radiograph 07/13/2019 FINDINGS: Cardiomediastinal contours are within normal limits. There is a rounded opacity projecting over the right lower lobe, possibly a nipple shadow. The lungs are otherwise clear. No pneumothorax or pleural effusion. No acute finding in the  visualized skeleton. IMPRESSION: Rounded opacity projecting over the right lower lobe, possibly a nipple shadow. Consider repeat PA and lateral radiographs with nipple markers. Electronically Signed   By: Audie Pinto M.D.   On: 06/09/2021 18:38   CT Head Wo Contrast  Result Date: 06/09/2021 CLINICAL DATA:  Headache. EXAM: CT HEAD WITHOUT CONTRAST TECHNIQUE: Contiguous axial images were obtained from the base of the skull through the vertex without intravenous contrast. COMPARISON:  05/12/2019 FINDINGS: Brain: No evidence of acute infarction, hemorrhage, hydrocephalus, extra-axial collection or mass lesion/mass effect. Vascular: No hyperdense vessel or unexpected calcification. Skull: Normal. Negative for fracture or focal lesion. Sinuses/Orbits: Globes and orbits are unremarkable. Visualized sinuses are clear. Other: None. IMPRESSION: Normal unenhanced CT scan of the brain. Electronically Signed   By: Lajean Manes M.D.   On: 06/09/2021 16:05    ____________________________________________    PROCEDURES  Procedure(s) performed:     Procedures     Medications  metoCLOPramide (REGLAN) injection 10 mg (10 mg Intravenous Given 06/09/21 1646)  diphenhydrAMINE (BENADRYL) injection 50 mg (50 mg Intravenous Given 06/09/21 1650)  sodium chloride 0.9 % bolus 1,000 mL (0 mLs Intravenous Stopped 06/09/21 1858)  ketorolac (TORADOL) 30 MG/ML injection 30 mg (30 mg Intravenous Given 06/09/21 1653)     ____________________________________________   INITIAL IMPRESSION / ASSESSMENT AND PLAN / ED COURSE  Pertinent labs & imaging results that were available during my care of the patient were reviewed by me and considered in my  medical decision making (see chart for details).     Assessment and Plan: Headache 50 year old female presents to the emergency department with a left-sided stabbing headache that started at 11 AM that was atypical for her.  Patient was hypertensive at triage but vital signs were otherwise reassuring.  She had no apparent neurodeficits on exam.  Patient has headache is atypical for patient, I will obtain CT head.  Will CT head is in process, will give Reglan, Benadryl and fluids.  Patient's initial CMP returned with sodium at 125.  Patient was given a liter of normal saline and repeat sodium was 134.  No acute abnormality on chest x-ray.  Patient felt that her headache had resolved after medications given in the emergency department.  Return precautions were given to return with new or worsening symptoms.      ____________________________________________  FINAL CLINICAL IMPRESSION(S) / ED DIAGNOSES  Final diagnoses:  Acute nonintractable headache, unspecified headache type      NEW MEDICATIONS STARTED DURING THIS VISIT:  ED Discharge Orders     None           This chart was dictated using voice recognition software/Dragon. Despite best efforts to proofread, errors can occur which can change the meaning. Any change was purely unintentional.     Lannie Fields, PA-C 06/09/21 2239    Nena Polio, MD 06/10/21 [phone]

## 2021-06-11 ENCOUNTER — Other Ambulatory Visit: Payer: Self-pay

## 2021-06-12 ENCOUNTER — Other Ambulatory Visit: Payer: Self-pay

## 2021-06-13 ENCOUNTER — Other Ambulatory Visit: Payer: Self-pay

## 2021-06-13 DIAGNOSIS — Z23 Encounter for immunization: Secondary | ICD-10-CM | POA: Diagnosis not present

## 2021-06-13 DIAGNOSIS — R002 Palpitations: Secondary | ICD-10-CM | POA: Diagnosis not present

## 2021-06-13 DIAGNOSIS — I493 Ventricular premature depolarization: Secondary | ICD-10-CM | POA: Diagnosis not present

## 2021-06-13 DIAGNOSIS — I10 Essential (primary) hypertension: Secondary | ICD-10-CM | POA: Diagnosis not present

## 2021-06-13 MED ORDER — CLONAZEPAM 0.5 MG PO TABS
ORAL_TABLET | ORAL | 2 refills | Status: DC
  Filled 2021-06-19: qty 30, 30d supply, fill #0
  Filled 2021-07-20: qty 30, 30d supply, fill #1
  Filled 2021-08-27: qty 30, 30d supply, fill #2

## 2021-06-14 ENCOUNTER — Other Ambulatory Visit: Payer: Self-pay

## 2021-06-19 ENCOUNTER — Other Ambulatory Visit: Payer: Self-pay

## 2021-06-22 DIAGNOSIS — H5203 Hypermetropia, bilateral: Secondary | ICD-10-CM | POA: Diagnosis not present

## 2021-07-02 ENCOUNTER — Other Ambulatory Visit: Payer: Self-pay

## 2021-07-02 DIAGNOSIS — R200 Anesthesia of skin: Secondary | ICD-10-CM | POA: Diagnosis not present

## 2021-07-02 DIAGNOSIS — G8929 Other chronic pain: Secondary | ICD-10-CM | POA: Diagnosis not present

## 2021-07-02 DIAGNOSIS — R519 Headache, unspecified: Secondary | ICD-10-CM | POA: Diagnosis not present

## 2021-07-02 DIAGNOSIS — R432 Parageusia: Secondary | ICD-10-CM | POA: Diagnosis not present

## 2021-07-02 MED ORDER — VENLAFAXINE HCL 37.5 MG PO TABS
ORAL_TABLET | ORAL | 3 refills | Status: AC
  Filled 2021-07-02: qty 60, 30d supply, fill #0

## 2021-07-02 MED ORDER — BUTALBITAL-APAP-CAFFEINE 50-325-40 MG PO TABS
ORAL_TABLET | ORAL | 1 refills | Status: AC
  Filled 2021-07-02: qty 30, 15d supply, fill #0

## 2021-07-05 ENCOUNTER — Other Ambulatory Visit: Payer: Self-pay | Admitting: Physician Assistant

## 2021-07-05 DIAGNOSIS — G8929 Other chronic pain: Secondary | ICD-10-CM

## 2021-07-05 DIAGNOSIS — R200 Anesthesia of skin: Secondary | ICD-10-CM

## 2021-07-05 DIAGNOSIS — R519 Headache, unspecified: Secondary | ICD-10-CM

## 2021-07-05 DIAGNOSIS — R202 Paresthesia of skin: Secondary | ICD-10-CM

## 2021-07-05 DIAGNOSIS — R432 Parageusia: Secondary | ICD-10-CM

## 2021-07-16 ENCOUNTER — Other Ambulatory Visit: Payer: Self-pay

## 2021-07-20 ENCOUNTER — Other Ambulatory Visit: Payer: Self-pay

## 2021-07-27 ENCOUNTER — Other Ambulatory Visit: Payer: Self-pay

## 2021-08-27 ENCOUNTER — Other Ambulatory Visit: Payer: Self-pay

## 2021-09-13 ENCOUNTER — Other Ambulatory Visit: Payer: Self-pay

## 2021-09-20 ENCOUNTER — Other Ambulatory Visit: Payer: Self-pay

## 2021-10-03 ENCOUNTER — Other Ambulatory Visit: Payer: Self-pay

## 2021-10-04 ENCOUNTER — Other Ambulatory Visit: Payer: Self-pay

## 2021-10-05 ENCOUNTER — Other Ambulatory Visit: Payer: Self-pay

## 2021-10-05 MED ORDER — CLONAZEPAM 0.5 MG PO TABS
0.5000 mg | ORAL_TABLET | ORAL | 1 refills | Status: DC
  Filled 2021-10-05: qty 30, 30d supply, fill #0
  Filled 2021-11-09: qty 30, 30d supply, fill #1

## 2021-11-01 ENCOUNTER — Other Ambulatory Visit: Payer: Self-pay

## 2021-11-01 MED ORDER — METOPROLOL TARTRATE 25 MG PO TABS
ORAL_TABLET | ORAL | 1 refills | Status: DC
  Filled 2021-11-01: qty 90, 36d supply, fill #0
  Filled 2021-12-27: qty 75, 30d supply, fill #1
  Filled 2021-12-31: qty 90, 36d supply, fill #1

## 2021-11-09 ENCOUNTER — Other Ambulatory Visit: Payer: Self-pay

## 2021-12-07 DIAGNOSIS — R002 Palpitations: Secondary | ICD-10-CM | POA: Diagnosis not present

## 2021-12-07 DIAGNOSIS — I10 Essential (primary) hypertension: Secondary | ICD-10-CM | POA: Diagnosis not present

## 2021-12-07 DIAGNOSIS — I493 Ventricular premature depolarization: Secondary | ICD-10-CM | POA: Diagnosis not present

## 2021-12-12 ENCOUNTER — Other Ambulatory Visit: Payer: Self-pay

## 2021-12-13 ENCOUNTER — Other Ambulatory Visit: Payer: Self-pay

## 2021-12-13 MED ORDER — CLONAZEPAM 0.5 MG PO TABS
ORAL_TABLET | ORAL | 1 refills | Status: AC
  Filled 2021-12-13: qty 30, 30d supply, fill #0
  Filled 2022-01-21: qty 30, 30d supply, fill #1

## 2021-12-24 DIAGNOSIS — Z0000 Encounter for general adult medical examination without abnormal findings: Secondary | ICD-10-CM | POA: Diagnosis not present

## 2021-12-24 DIAGNOSIS — Z0131 Encounter for examination of blood pressure with abnormal findings: Secondary | ICD-10-CM | POA: Diagnosis not present

## 2021-12-24 DIAGNOSIS — I10 Essential (primary) hypertension: Secondary | ICD-10-CM | POA: Diagnosis not present

## 2021-12-24 DIAGNOSIS — Z0130 Encounter for examination of blood pressure without abnormal findings: Secondary | ICD-10-CM | POA: Diagnosis not present

## 2021-12-24 DIAGNOSIS — Z1389 Encounter for screening for other disorder: Secondary | ICD-10-CM | POA: Diagnosis not present

## 2021-12-24 DIAGNOSIS — E039 Hypothyroidism, unspecified: Secondary | ICD-10-CM | POA: Diagnosis not present

## 2021-12-26 DIAGNOSIS — R002 Palpitations: Secondary | ICD-10-CM | POA: Diagnosis not present

## 2021-12-27 ENCOUNTER — Other Ambulatory Visit: Payer: Self-pay

## 2021-12-28 ENCOUNTER — Other Ambulatory Visit: Payer: Self-pay | Admitting: Family Medicine

## 2021-12-28 DIAGNOSIS — Z1231 Encounter for screening mammogram for malignant neoplasm of breast: Secondary | ICD-10-CM

## 2021-12-31 ENCOUNTER — Other Ambulatory Visit: Payer: Self-pay

## 2022-01-11 ENCOUNTER — Other Ambulatory Visit: Payer: Self-pay

## 2022-01-11 MED ORDER — LEVOTHYROXINE SODIUM 88 MCG PO TABS
ORAL_TABLET | ORAL | 2 refills | Status: DC
  Filled 2022-01-11: qty 90, 90d supply, fill #0
  Filled 2022-05-21: qty 90, 90d supply, fill #1
  Filled 2022-09-25: qty 90, 90d supply, fill #2

## 2022-01-21 ENCOUNTER — Other Ambulatory Visit: Payer: Self-pay

## 2022-02-26 ENCOUNTER — Other Ambulatory Visit: Payer: Self-pay

## 2022-02-28 ENCOUNTER — Other Ambulatory Visit: Payer: Self-pay

## 2022-02-28 MED ORDER — CLONAZEPAM 0.5 MG PO TABS
0.5000 mg | ORAL_TABLET | Freq: Every day | ORAL | 5 refills | Status: AC
  Filled 2022-02-28: qty 30, 30d supply, fill #0
  Filled 2022-03-27: qty 30, 30d supply, fill #1

## 2022-03-25 DIAGNOSIS — I10 Essential (primary) hypertension: Secondary | ICD-10-CM | POA: Diagnosis not present

## 2022-03-25 DIAGNOSIS — F419 Anxiety disorder, unspecified: Secondary | ICD-10-CM | POA: Diagnosis not present

## 2022-03-25 DIAGNOSIS — R002 Palpitations: Secondary | ICD-10-CM | POA: Diagnosis not present

## 2022-03-25 DIAGNOSIS — I493 Ventricular premature depolarization: Secondary | ICD-10-CM | POA: Diagnosis not present

## 2022-03-26 ENCOUNTER — Other Ambulatory Visit: Payer: Self-pay

## 2022-03-26 MED ORDER — CLONAZEPAM 0.5 MG PO TABS
ORAL_TABLET | ORAL | 2 refills | Status: AC
  Filled 2022-03-26 – 2022-03-28 (×2): qty 45, 30d supply, fill #0
  Filled 2022-05-06: qty 45, 30d supply, fill #1
  Filled 2022-06-14: qty 45, 30d supply, fill #2

## 2022-03-28 ENCOUNTER — Other Ambulatory Visit: Payer: Self-pay

## 2022-04-04 ENCOUNTER — Other Ambulatory Visit: Payer: Self-pay

## 2022-04-04 MED ORDER — METOPROLOL TARTRATE 25 MG PO TABS
ORAL_TABLET | ORAL | 1 refills | Status: DC
  Filled 2022-04-04: qty 90, 36d supply, fill #0
  Filled 2022-06-18: qty 90, 36d supply, fill #1

## 2022-04-08 ENCOUNTER — Other Ambulatory Visit: Payer: Self-pay

## 2022-04-24 ENCOUNTER — Inpatient Hospital Stay: Admission: RE | Admit: 2022-04-24 | Payer: 59 | Source: Ambulatory Visit

## 2022-05-06 ENCOUNTER — Other Ambulatory Visit: Payer: Self-pay

## 2022-05-21 ENCOUNTER — Other Ambulatory Visit: Payer: Self-pay

## 2022-06-18 ENCOUNTER — Other Ambulatory Visit: Payer: Self-pay

## 2022-07-12 ENCOUNTER — Other Ambulatory Visit: Payer: Self-pay

## 2022-07-12 DIAGNOSIS — E039 Hypothyroidism, unspecified: Secondary | ICD-10-CM | POA: Diagnosis not present

## 2022-07-12 DIAGNOSIS — R634 Abnormal weight loss: Secondary | ICD-10-CM | POA: Diagnosis not present

## 2022-07-12 DIAGNOSIS — R002 Palpitations: Secondary | ICD-10-CM | POA: Diagnosis not present

## 2022-07-12 DIAGNOSIS — F411 Generalized anxiety disorder: Secondary | ICD-10-CM | POA: Diagnosis not present

## 2022-07-12 MED ORDER — ESCITALOPRAM OXALATE 10 MG PO TABS
10.0000 mg | ORAL_TABLET | Freq: Every day | ORAL | 1 refills | Status: AC
  Filled 2022-07-12: qty 30, 30d supply, fill #0

## 2022-07-12 MED ORDER — CLONAZEPAM 0.5 MG PO TABS
0.5000 mg | ORAL_TABLET | Freq: Two times a day (BID) | ORAL | 0 refills | Status: AC | PRN
  Filled 2022-07-12: qty 30, 15d supply, fill #0

## 2022-07-30 ENCOUNTER — Other Ambulatory Visit: Payer: Self-pay

## 2022-09-05 ENCOUNTER — Other Ambulatory Visit: Payer: Self-pay

## 2022-09-05 MED ORDER — CLONAZEPAM 0.5 MG PO TABS
0.5000 mg | ORAL_TABLET | Freq: Every day | ORAL | 2 refills | Status: DC
  Filled 2022-09-05: qty 45, 30d supply, fill #0
  Filled 2022-10-14: qty 45, 30d supply, fill #1
  Filled 2022-11-25: qty 45, 30d supply, fill #2

## 2022-09-18 ENCOUNTER — Other Ambulatory Visit: Payer: Self-pay

## 2022-09-18 MED ORDER — METOPROLOL TARTRATE 25 MG PO TABS
ORAL_TABLET | ORAL | 1 refills | Status: DC
  Filled 2022-09-18: qty 90, 36d supply, fill #0
  Filled 2022-11-16: qty 90, 36d supply, fill #1

## 2022-09-23 DIAGNOSIS — F419 Anxiety disorder, unspecified: Secondary | ICD-10-CM | POA: Diagnosis not present

## 2022-09-23 DIAGNOSIS — I10 Essential (primary) hypertension: Secondary | ICD-10-CM | POA: Diagnosis not present

## 2022-09-23 DIAGNOSIS — R002 Palpitations: Secondary | ICD-10-CM | POA: Diagnosis not present

## 2022-09-23 DIAGNOSIS — I493 Ventricular premature depolarization: Secondary | ICD-10-CM | POA: Diagnosis not present

## 2022-09-25 ENCOUNTER — Other Ambulatory Visit: Payer: Self-pay

## 2022-10-14 ENCOUNTER — Other Ambulatory Visit: Payer: Self-pay

## 2022-11-19 ENCOUNTER — Other Ambulatory Visit: Payer: Self-pay

## 2022-11-21 ENCOUNTER — Other Ambulatory Visit: Payer: Self-pay

## 2022-11-25 ENCOUNTER — Other Ambulatory Visit: Payer: Self-pay

## 2022-11-26 ENCOUNTER — Other Ambulatory Visit: Payer: Self-pay

## 2022-12-02 ENCOUNTER — Other Ambulatory Visit: Payer: Self-pay

## 2022-12-03 ENCOUNTER — Other Ambulatory Visit: Payer: Self-pay

## 2022-12-03 ENCOUNTER — Encounter: Payer: Self-pay | Admitting: Intensive Care

## 2022-12-03 ENCOUNTER — Emergency Department
Admission: EM | Admit: 2022-12-03 | Discharge: 2022-12-03 | Disposition: A | Discharge status: Home / Self Care | Payer: Commercial Managed Care - PPO | Attending: Emergency Medicine | Admitting: Emergency Medicine

## 2022-12-03 ENCOUNTER — Emergency Department: Payer: Commercial Managed Care - PPO

## 2022-12-03 DIAGNOSIS — F419 Anxiety disorder, unspecified: Secondary | ICD-10-CM | POA: Insufficient documentation

## 2022-12-03 DIAGNOSIS — R002 Palpitations: Secondary | ICD-10-CM | POA: Insufficient documentation

## 2022-12-03 DIAGNOSIS — R079 Chest pain, unspecified: Secondary | ICD-10-CM | POA: Diagnosis not present

## 2022-12-03 LAB — MAGNESIUM: Magnesium: 2.2 mg/dL (ref 1.7–2.4)

## 2022-12-03 LAB — BASIC METABOLIC PANEL
Anion gap: 11 (ref 5–15)
BUN: 12 mg/dL (ref 6–20)
CO2: 21 mmol/L — ABNORMAL LOW (ref 22–32)
Calcium: 9.3 mg/dL (ref 8.9–10.3)
Chloride: 103 mmol/L (ref 98–111)
Creatinine, Ser: 0.47 mg/dL (ref 0.44–1.00)
GFR, Estimated: >60 mL/min (ref >60)
Glucose, Bld: 113 mg/dL — ABNORMAL HIGH (ref 70–99)
Potassium: 3.5 mmol/L (ref 3.5–5.1)
Sodium: 135 mmol/L (ref 135–145)

## 2022-12-03 LAB — CBC WITH DIFFERENTIAL/PLATELET
Abs Immature Granulocytes: 0.02 10*3/uL (ref 0.00–0.07)
Basophils Absolute: 0.1 10*3/uL (ref 0.0–0.1)
Basophils Relative: 1 %
Eosinophils Absolute: 0.1 10*3/uL (ref 0.0–0.5)
Eosinophils Relative: 1 %
HCT: 38.2 % (ref 36.0–46.0)
Hemoglobin: 12.9 g/dL (ref 12.0–15.0)
Immature Granulocytes: 0 %
Lymphocytes Relative: 29 %
Lymphs Abs: 2.6 10*3/uL (ref 0.7–4.0)
MCH: 32.3 pg (ref 26.0–34.0)
MCHC: 33.8 g/dL (ref 30.0–36.0)
MCV: 95.5 fL (ref 80.0–100.0)
Monocytes Absolute: 0.7 10*3/uL (ref 0.1–1.0)
Monocytes Relative: 7 %
Neutro Abs: 5.6 10*3/uL (ref 1.7–7.7)
Neutrophils Relative %: 62 %
Platelets: 239 10*3/uL (ref 150–400)
RBC: 4 MIL/uL (ref 3.87–5.11)
RDW: 11.8 % (ref 11.5–15.5)
WBC: 9 10*3/uL (ref 4.0–10.5)
nRBC: 0 % (ref 0.0–0.2)

## 2022-12-03 LAB — TSH: TSH: 2.204 u[IU]/mL (ref 0.350–4.500)

## 2022-12-03 LAB — TROPONIN I (HIGH SENSITIVITY): Troponin I (High Sensitivity): 4 ng/L (ref <18)

## 2022-12-03 MED ORDER — HYDROXYZINE HCL 10 MG PO TABS
10.0000 mg | ORAL_TABLET | Freq: Every day | ORAL | 0 refills | Status: AC
  Filled 2022-12-03: qty 5, 5d supply, fill #0

## 2022-12-03 NOTE — ED Triage Notes (Addendum)
Patient reports she has had palpitations for four days and cannot sleep due to feeling her heart racing. Reports her left arm feels "weird." When asked if it feels tingling and she says yes. Denies pain  History anxiety

## 2022-12-03 NOTE — ED Provider Notes (Signed)
[facility] Provider Note    Event Date/Time   First MD Initiated Contact with Patient 12/03/22 1134     (approximate)   History   Palpitations   HPI  Tiffany Bradford is a 52 y.o. female with a past medical history of anxiety on daily clonazepam who presents today for evaluation of palpitations.  Patient reports that she stopped taking her clonazepam 4 days ago because she reports it is a new company and she does not like the way this is making her feel.  She reports that her palpitations feel like her heart is beating very forcefully and keeping her up at night.  She denies chest pain or shortness of breath.  She has not had any nausea or diaphoresis.  She is requesting something for anxiety.  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   Headache 06/02/2014   Acute onset aura migraine 05/25/2014   Adult hypothyroidism 05/25/2014   ANA positive 04/06/2014   De Quervain's disease (radial styloid tenosynovitis) 04/06/2014          Physical Exam   Triage Vital Signs: ED Triage Vitals  Enc Vitals Group     BP 12/03/22 1104 (!) 155/79     Pulse Rate 12/03/22 1104 70     Resp 12/03/22 1104 20     Temp 12/03/22 1105 97.9 F (36.6 C)     Temp Source 12/03/22 1105 Oral     SpO2 12/03/22 1104 96 %     Weight 12/03/22 1105 120 lb (54.4 kg)     Height 12/03/22 1105 5' (1.524 m)     Head Circumference --      Peak Flow --      Pain Score 12/03/22 1105 0     Pain Loc --      Pain Edu? --      Excl. in GC? --     Most recent vital signs: Vitals:   12/03/22 1105 12/03/22 1330  BP:  (!) 150/76  Pulse:  72  Resp:  18  Temp: 97.9 F (36.6 C)   SpO2:  98%    Physical  Exam Vitals and nursing note reviewed.  Constitutional:      General: Awake and alert. No acute distress.    Appearance: Normal appearance. The patient is normal weight.  HENT:     Head: Normocephalic and atraumatic.     Mouth: Mucous membranes are moist.  Eyes:     General: PERRL. Normal EOMs        Right eye: No discharge.        Left eye: No discharge.     Conjunctiva/sclera: Conjunctivae normal.  Cardiovascular:     Rate and Rhythm: Normal rate and regular rhythm.     Pulses: Normal pulses.  Pulmonary:     Effort: Pulmonary effort is normal. No respiratory distress.     Breath sounds: Normal breath sounds.  Abdominal:     Abdomen is soft. There is no abdominal tenderness. No rebound or guarding. No distention. Musculoskeletal:        General: No swelling. Normal range of motion.     Cervical back: Normal range of motion and neck supple.  Skin:    General: Skin is  warm and dry.     Capillary Refill: Capillary refill takes less than 2 seconds.     Findings: No rash.  Neurological:     Mental Status: The patient is awake and alert.  Psych: Slightly anxious in appearance, though calm, cooperative, linear, goal-directed     ED Results / Procedures / Treatments   Labs (all labs ordered are listed, but only abnormal results are displayed) Labs Reviewed  BASIC METABOLIC PANEL - Abnormal; Notable for the following components:      Result Value   CO2 21 (*)    Glucose, Bld 113 (*)    All other components within normal limits  TSH  MAGNESIUM  CBC WITH DIFFERENTIAL/PLATELET  TROPONIN I (HIGH SENSITIVITY)  TROPONIN I (HIGH SENSITIVITY)     EKG     RADIOLOGY I independently reviewed and interpreted imaging and agree with radiologists findings.     PROCEDURES:  Critical Care performed:   Procedures   MEDICATIONS ORDERED IN ED: Medications - No data to display   IMPRESSION / MDM / ASSESSMENT AND PLAN / ED COURSE  I reviewed the triage vital signs and the  nursing notes.   Differential diagnosis includes, but is not limited to, anxiety, electrolyte disarray, thyroid abnormality, dehydration, medication reaction.  Patient is awake and alert, anxious in appearance and hemodynamically stable and afebrile.  EKG obtained in triage demonstrates normal sinus rhythm.  Further workup is indicated.  IV was established and labs were obtained.  She was placed on the cardiac monitor.  Labs are overall reassuring.  There is no anemia, electrolyte disarray, thyroid abnormality, patient has normal troponin.  I suspect that her symptoms are due to discontinuing the clonazepam abruptly.  She wishes to try something else, and therefore was given a prescription for Atarax, though she was advised that she cannot take this with the clonazepam.  She is also advised that she cannot drive, operate machinery, or perform any test that require concentration while taking this medication.  She was instructed to follow-up with her prescribing physician to discuss the clonazepam.  The patient has had normal heart rate in the emergency department.  There is no shortness of breath, nausea, diaphoresis, or exertional symptoms, do not suspect acute coronary syndrome.  She is Wells score 0, do not suspect pulmonary embolism.  We discussed return precautions and the importance of close outpatient follow-up.  Patient understands and agrees with plan.  She was discharged in stable condition with her family member.     Patient's presentation is most consistent with acute complicated illness / injury requiring diagnostic workup.    FINAL CLINICAL IMPRESSION(S) / ED DIAGNOSES   Final diagnoses:  Anxiety  Palpitations     Rx / DC Orders   ED Discharge Orders          Ordered    hydrOXYzine (ATARAX) 10 MG tablet  Daily at bedtime        12/03/22 1345             Note:  This document was prepared using Dragon voice recognition software and may include unintentional dictation  errors.   Jackelyn Hoehn, PA-C 12/03/22 1453    Trinna Post, MD 12/03/22 1930

## 2022-12-03 NOTE — Discharge Instructions (Signed)
Your symptoms are possibly due to the fact that you stopped your clonazepam.  You can try the new medicine that was prescribed to help with your symptoms instead.  Please do not take this with the clonazepam.  Please talk to your prescribing physician about your clonazepam to see if there is a better medicine for you.  Remember you cannot drive, operate heavy machinery, or perform any test that require concentration while taking either the clonazepam or the Atarax that was prescribed today.  Return for any new, worsening, or change in symptoms or other concerns.  It was a pleasure caring for you today.

## 2022-12-13 ENCOUNTER — Other Ambulatory Visit: Payer: Self-pay

## 2022-12-13 DIAGNOSIS — Z0000 Encounter for general adult medical examination without abnormal findings: Secondary | ICD-10-CM | POA: Diagnosis not present

## 2022-12-13 DIAGNOSIS — Z1389 Encounter for screening for other disorder: Secondary | ICD-10-CM | POA: Diagnosis not present

## 2022-12-13 DIAGNOSIS — F411 Generalized anxiety disorder: Secondary | ICD-10-CM | POA: Diagnosis not present

## 2022-12-13 DIAGNOSIS — Z0131 Encounter for examination of blood pressure with abnormal findings: Secondary | ICD-10-CM | POA: Diagnosis not present

## 2022-12-13 DIAGNOSIS — Z0130 Encounter for examination of blood pressure without abnormal findings: Secondary | ICD-10-CM | POA: Diagnosis not present

## 2022-12-13 MED ORDER — ESCITALOPRAM OXALATE 10 MG PO TABS
10.0000 mg | ORAL_TABLET | Freq: Every day | ORAL | 1 refills | Status: AC
  Filled 2022-12-13: qty 30, 30d supply, fill #0

## 2022-12-13 MED ORDER — METOPROLOL SUCCINATE ER 25 MG PO TB24
25.0000 mg | ORAL_TABLET | Freq: Every day | ORAL | 1 refills | Status: AC
  Filled 2022-12-13: qty 90, 90d supply, fill #0

## 2022-12-13 MED ORDER — LEVOTHYROXINE SODIUM 88 MCG PO TABS
88.0000 ug | ORAL_TABLET | Freq: Every day | ORAL | 2 refills | Status: DC
  Filled 2022-12-13 – 2023-02-06 (×2): qty 90, 90d supply, fill #0
  Filled 2023-06-23: qty 90, 90d supply, fill #1
  Filled 2023-11-27: qty 90, 90d supply, fill #2

## 2022-12-16 DIAGNOSIS — E039 Hypothyroidism, unspecified: Secondary | ICD-10-CM | POA: Diagnosis not present

## 2022-12-16 DIAGNOSIS — R002 Palpitations: Secondary | ICD-10-CM | POA: Diagnosis not present

## 2022-12-16 DIAGNOSIS — R634 Abnormal weight loss: Secondary | ICD-10-CM | POA: Diagnosis not present

## 2022-12-31 ENCOUNTER — Other Ambulatory Visit: Payer: Self-pay

## 2023-01-10 DIAGNOSIS — Z0131 Encounter for examination of blood pressure with abnormal findings: Secondary | ICD-10-CM | POA: Diagnosis not present

## 2023-01-10 DIAGNOSIS — E876 Hypokalemia: Secondary | ICD-10-CM | POA: Diagnosis not present

## 2023-01-10 DIAGNOSIS — I10 Essential (primary) hypertension: Secondary | ICD-10-CM | POA: Diagnosis not present

## 2023-01-10 DIAGNOSIS — Z1389 Encounter for screening for other disorder: Secondary | ICD-10-CM | POA: Diagnosis not present

## 2023-01-10 DIAGNOSIS — Z0130 Encounter for examination of blood pressure without abnormal findings: Secondary | ICD-10-CM | POA: Diagnosis not present

## 2023-01-13 ENCOUNTER — Other Ambulatory Visit: Payer: Self-pay | Admitting: Family Medicine

## 2023-01-13 DIAGNOSIS — Z1231 Encounter for screening mammogram for malignant neoplasm of breast: Secondary | ICD-10-CM

## 2023-01-21 ENCOUNTER — Other Ambulatory Visit: Payer: Self-pay

## 2023-01-22 ENCOUNTER — Other Ambulatory Visit: Payer: Self-pay

## 2023-01-22 MED ORDER — METOPROLOL TARTRATE 25 MG PO TABS
ORAL_TABLET | ORAL | 3 refills | Status: AC
  Filled 2023-01-22: qty 225, 90d supply, fill #0

## 2023-01-22 MED ORDER — CLONAZEPAM 0.5 MG PO TABS
0.5000 mg | ORAL_TABLET | Freq: Every day | ORAL | 2 refills | Status: DC
  Filled 2023-01-22: qty 45, 23d supply, fill #0
  Filled 2023-03-19: qty 45, 23d supply, fill #1
  Filled 2023-05-14: qty 45, 23d supply, fill #2

## 2023-01-31 DIAGNOSIS — Z0000 Encounter for general adult medical examination without abnormal findings: Secondary | ICD-10-CM | POA: Diagnosis not present

## 2023-02-06 ENCOUNTER — Other Ambulatory Visit: Payer: Self-pay

## 2023-03-13 ENCOUNTER — Ambulatory Visit: Payer: Commercial Managed Care - PPO

## 2023-03-14 ENCOUNTER — Other Ambulatory Visit: Payer: Self-pay

## 2023-03-14 DIAGNOSIS — Z1389 Encounter for screening for other disorder: Secondary | ICD-10-CM | POA: Diagnosis not present

## 2023-03-14 DIAGNOSIS — I10 Essential (primary) hypertension: Secondary | ICD-10-CM | POA: Diagnosis not present

## 2023-03-14 DIAGNOSIS — F411 Generalized anxiety disorder: Secondary | ICD-10-CM | POA: Diagnosis not present

## 2023-03-14 MED ORDER — METOPROLOL TARTRATE 50 MG PO TABS
50.0000 mg | ORAL_TABLET | Freq: Two times a day (BID) | ORAL | 3 refills | Status: DC
  Filled 2023-03-14: qty 180, 90d supply, fill #0
  Filled 2023-07-24: qty 180, 90d supply, fill #1
  Filled 2023-10-22: qty 180, 90d supply, fill #2
  Filled 2023-12-30 – 2024-01-02 (×4): qty 180, 90d supply, fill #3

## 2023-03-14 MED ORDER — ESCITALOPRAM OXALATE 20 MG PO TABS
20.0000 mg | ORAL_TABLET | Freq: Every day | ORAL | 11 refills | Status: AC
  Filled 2023-03-14: qty 30, 30d supply, fill #0

## 2023-03-20 ENCOUNTER — Other Ambulatory Visit: Payer: Self-pay

## 2023-04-22 DIAGNOSIS — R002 Palpitations: Secondary | ICD-10-CM | POA: Diagnosis not present

## 2023-04-22 DIAGNOSIS — F419 Anxiety disorder, unspecified: Secondary | ICD-10-CM | POA: Diagnosis not present

## 2023-04-22 DIAGNOSIS — I10 Essential (primary) hypertension: Secondary | ICD-10-CM | POA: Diagnosis not present

## 2023-04-22 DIAGNOSIS — I493 Ventricular premature depolarization: Secondary | ICD-10-CM | POA: Diagnosis not present

## 2023-05-14 ENCOUNTER — Other Ambulatory Visit: Payer: Self-pay

## 2023-06-06 ENCOUNTER — Other Ambulatory Visit: Payer: Self-pay

## 2023-06-06 DIAGNOSIS — F411 Generalized anxiety disorder: Secondary | ICD-10-CM | POA: Diagnosis not present

## 2023-06-06 DIAGNOSIS — I10 Essential (primary) hypertension: Secondary | ICD-10-CM | POA: Diagnosis not present

## 2023-06-06 DIAGNOSIS — Z1389 Encounter for screening for other disorder: Secondary | ICD-10-CM | POA: Diagnosis not present

## 2023-06-06 MED ORDER — HYDROCHLOROTHIAZIDE 12.5 MG PO TABS
12.5000 mg | ORAL_TABLET | Freq: Every day | ORAL | 4 refills | Status: AC
  Filled 2023-06-06: qty 90, 90d supply, fill #0

## 2023-06-17 ENCOUNTER — Other Ambulatory Visit: Payer: Self-pay

## 2023-06-23 ENCOUNTER — Other Ambulatory Visit: Payer: Self-pay

## 2023-07-13 ENCOUNTER — Other Ambulatory Visit: Payer: Self-pay

## 2023-07-14 ENCOUNTER — Other Ambulatory Visit: Payer: Self-pay

## 2023-07-15 ENCOUNTER — Other Ambulatory Visit: Payer: Self-pay

## 2023-07-15 MED ORDER — CLONAZEPAM 0.5 MG PO TABS
0.5000 mg | ORAL_TABLET | Freq: Every day | ORAL | 2 refills | Status: AC
  Filled 2023-07-15: qty 45, 30d supply, fill #0
  Filled 2023-09-15: qty 45, 30d supply, fill #1
  Filled 2023-11-07: qty 45, 30d supply, fill #2

## 2023-07-24 ENCOUNTER — Other Ambulatory Visit: Payer: Self-pay

## 2023-08-21 DIAGNOSIS — J069 Acute upper respiratory infection, unspecified: Secondary | ICD-10-CM | POA: Diagnosis not present

## 2023-08-21 DIAGNOSIS — Z0131 Encounter for examination of blood pressure with abnormal findings: Secondary | ICD-10-CM | POA: Diagnosis not present

## 2023-08-21 DIAGNOSIS — Z1389 Encounter for screening for other disorder: Secondary | ICD-10-CM | POA: Diagnosis not present

## 2023-08-21 DIAGNOSIS — Z0130 Encounter for examination of blood pressure without abnormal findings: Secondary | ICD-10-CM | POA: Diagnosis not present

## 2023-09-15 ENCOUNTER — Other Ambulatory Visit: Payer: Self-pay

## 2023-09-22 ENCOUNTER — Other Ambulatory Visit: Payer: Self-pay

## 2023-09-22 DIAGNOSIS — F411 Generalized anxiety disorder: Secondary | ICD-10-CM | POA: Diagnosis not present

## 2023-09-22 DIAGNOSIS — Z0131 Encounter for examination of blood pressure with abnormal findings: Secondary | ICD-10-CM | POA: Diagnosis not present

## 2023-09-22 DIAGNOSIS — F410 Panic disorder [episodic paroxysmal anxiety] without agoraphobia: Secondary | ICD-10-CM | POA: Diagnosis not present

## 2023-09-22 DIAGNOSIS — I10 Essential (primary) hypertension: Secondary | ICD-10-CM | POA: Diagnosis not present

## 2023-09-22 DIAGNOSIS — Z0130 Encounter for examination of blood pressure without abnormal findings: Secondary | ICD-10-CM | POA: Diagnosis not present

## 2023-09-22 DIAGNOSIS — Z1389 Encounter for screening for other disorder: Secondary | ICD-10-CM | POA: Diagnosis not present

## 2023-09-22 MED ORDER — PAROXETINE HCL 10 MG PO TABS
10.0000 mg | ORAL_TABLET | Freq: Every day | ORAL | 1 refills | Status: AC
  Filled 2023-09-22: qty 90, 90d supply, fill #0

## 2023-09-22 MED ORDER — HYDROCHLOROTHIAZIDE 12.5 MG PO TABS
12.5000 mg | ORAL_TABLET | Freq: Every day | ORAL | 4 refills | Status: AC
  Filled 2023-09-22: qty 90, 90d supply, fill #0

## 2023-09-24 ENCOUNTER — Other Ambulatory Visit: Payer: Self-pay

## 2023-10-07 ENCOUNTER — Other Ambulatory Visit: Payer: Self-pay

## 2023-10-07 DIAGNOSIS — R300 Dysuria: Secondary | ICD-10-CM | POA: Diagnosis not present

## 2023-10-07 DIAGNOSIS — N390 Urinary tract infection, site not specified: Secondary | ICD-10-CM | POA: Diagnosis not present

## 2023-10-07 MED ORDER — NITROFURANTOIN MONOHYD MACRO 100 MG PO CAPS
100.0000 mg | ORAL_CAPSULE | Freq: Two times a day (BID) | ORAL | 0 refills | Status: DC
  Filled 2023-10-07: qty 14, 7d supply, fill #0

## 2023-10-22 ENCOUNTER — Other Ambulatory Visit: Payer: Self-pay

## 2023-11-03 DIAGNOSIS — R002 Palpitations: Secondary | ICD-10-CM | POA: Diagnosis not present

## 2023-11-03 DIAGNOSIS — I493 Ventricular premature depolarization: Secondary | ICD-10-CM | POA: Diagnosis not present

## 2023-11-03 DIAGNOSIS — I10 Essential (primary) hypertension: Secondary | ICD-10-CM | POA: Diagnosis not present

## 2023-11-03 DIAGNOSIS — F419 Anxiety disorder, unspecified: Secondary | ICD-10-CM | POA: Diagnosis not present

## 2023-11-07 ENCOUNTER — Other Ambulatory Visit: Payer: Self-pay

## 2023-11-21 DIAGNOSIS — Z0130 Encounter for examination of blood pressure without abnormal findings: Secondary | ICD-10-CM | POA: Diagnosis not present

## 2023-11-21 DIAGNOSIS — Z0131 Encounter for examination of blood pressure with abnormal findings: Secondary | ICD-10-CM | POA: Diagnosis not present

## 2023-11-21 DIAGNOSIS — N390 Urinary tract infection, site not specified: Secondary | ICD-10-CM | POA: Diagnosis not present

## 2023-11-21 DIAGNOSIS — Z1389 Encounter for screening for other disorder: Secondary | ICD-10-CM | POA: Diagnosis not present

## 2023-11-21 DIAGNOSIS — F410 Panic disorder [episodic paroxysmal anxiety] without agoraphobia: Secondary | ICD-10-CM | POA: Diagnosis not present

## 2023-11-21 DIAGNOSIS — I10 Essential (primary) hypertension: Secondary | ICD-10-CM | POA: Diagnosis not present

## 2023-11-24 ENCOUNTER — Other Ambulatory Visit: Payer: Self-pay | Admitting: Family Medicine

## 2023-11-24 DIAGNOSIS — Z1231 Encounter for screening mammogram for malignant neoplasm of breast: Secondary | ICD-10-CM

## 2023-11-25 ENCOUNTER — Other Ambulatory Visit: Payer: Self-pay

## 2023-11-25 MED ORDER — NITROFURANTOIN MONOHYD MACRO 100 MG PO CAPS
100.0000 mg | ORAL_CAPSULE | Freq: Two times a day (BID) | ORAL | 0 refills | Status: AC
  Filled 2023-11-25: qty 10, 5d supply, fill #0

## 2023-11-27 ENCOUNTER — Other Ambulatory Visit: Payer: Self-pay

## 2023-11-28 DIAGNOSIS — R079 Chest pain, unspecified: Secondary | ICD-10-CM | POA: Diagnosis not present

## 2023-11-28 DIAGNOSIS — E871 Hypo-osmolality and hyponatremia: Secondary | ICD-10-CM | POA: Diagnosis not present

## 2023-11-28 DIAGNOSIS — R002 Palpitations: Secondary | ICD-10-CM | POA: Diagnosis not present

## 2023-11-28 DIAGNOSIS — I10 Essential (primary) hypertension: Secondary | ICD-10-CM | POA: Diagnosis not present

## 2023-11-28 DIAGNOSIS — R0602 Shortness of breath: Secondary | ICD-10-CM | POA: Diagnosis not present

## 2023-11-28 DIAGNOSIS — E876 Hypokalemia: Secondary | ICD-10-CM | POA: Diagnosis not present

## 2023-12-02 ENCOUNTER — Emergency Department

## 2023-12-02 ENCOUNTER — Encounter: Payer: Self-pay | Admitting: Emergency Medicine

## 2023-12-02 ENCOUNTER — Other Ambulatory Visit: Payer: Self-pay

## 2023-12-02 DIAGNOSIS — R0789 Other chest pain: Secondary | ICD-10-CM | POA: Diagnosis not present

## 2023-12-02 DIAGNOSIS — I10 Essential (primary) hypertension: Secondary | ICD-10-CM | POA: Diagnosis not present

## 2023-12-02 DIAGNOSIS — R002 Palpitations: Secondary | ICD-10-CM | POA: Diagnosis not present

## 2023-12-02 DIAGNOSIS — R079 Chest pain, unspecified: Secondary | ICD-10-CM | POA: Diagnosis not present

## 2023-12-02 LAB — CBC WITH DIFFERENTIAL/PLATELET
Abs Immature Granulocytes: 0.02 10*3/uL (ref 0.00–0.07)
Basophils Absolute: 0.1 10*3/uL (ref 0.0–0.1)
Basophils Relative: 1 %
Eosinophils Absolute: 0.2 10*3/uL (ref 0.0–0.5)
Eosinophils Relative: 2 %
HCT: 36.6 % (ref 36.0–46.0)
Hemoglobin: 12.6 g/dL (ref 12.0–15.0)
Immature Granulocytes: 0 %
Lymphocytes Relative: 57 %
Lymphs Abs: 5.2 10*3/uL — ABNORMAL HIGH (ref 0.7–4.0)
MCH: 32.6 pg (ref 26.0–34.0)
MCHC: 34.4 g/dL (ref 30.0–36.0)
MCV: 94.8 fL (ref 80.0–100.0)
Monocytes Absolute: 0.8 10*3/uL (ref 0.1–1.0)
Monocytes Relative: 9 %
Neutro Abs: 2.7 10*3/uL (ref 1.7–7.7)
Neutrophils Relative %: 31 %
Platelets: 256 10*3/uL (ref 150–400)
RBC: 3.86 MIL/uL — ABNORMAL LOW (ref 3.87–5.11)
RDW: 12.4 % (ref 11.5–15.5)
WBC: 8.9 10*3/uL (ref 4.0–10.5)
nRBC: 0 % (ref 0.0–0.2)

## 2023-12-02 LAB — TROPONIN I (HIGH SENSITIVITY)
Troponin I (High Sensitivity): 4 ng/L (ref <18)
Troponin I (High Sensitivity): 7 ng/L (ref <18)

## 2023-12-02 LAB — BASIC METABOLIC PANEL WITH GFR
Anion gap: 11 (ref 5–15)
BUN: 18 mg/dL (ref 6–20)
CO2: 22 mmol/L (ref 22–32)
Calcium: 9.4 mg/dL (ref 8.9–10.3)
Chloride: 101 mmol/L (ref 98–111)
Creatinine, Ser: 0.58 mg/dL (ref 0.44–1.00)
GFR, Estimated: >60 mL/min (ref >60)
Glucose, Bld: 145 mg/dL — ABNORMAL HIGH (ref 70–99)
Potassium: 3.9 mmol/L (ref 3.5–5.1)
Sodium: 134 mmol/L — ABNORMAL LOW (ref 135–145)

## 2023-12-02 NOTE — ED Triage Notes (Signed)
 Pt in via POV, reports sudden onset palpitations w/ some mild chest pain.  Denies doing anything strenuous at onset; also complains of fatigue, denies any other associated symptoms.  Ambulatory to triage.  NAD noted at this time.

## 2023-12-02 NOTE — ED Provider Triage Note (Signed)
 Emergency Medicine Provider Triage Evaluation Note  Tiffany Bradford , a 53 y.o. female  was evaluated in triage.  Pt complains of chest pain.  Review of Systems  Positive: CP Negative:   Physical Exam  There were no vitals taken for this visit. Gen:   Awake, no distress   Resp:  Normal effort  MSK:   Moves extremities without difficulty  Other:    Medical Decision Making  Medically screening exam initiated at 8:29 PM.  Appropriate orders placed.  Tiffany Bradford was informed that the remainder of the evaluation will be completed by another provider, this initial triage assessment does not replace that evaluation, and the importance of remaining in the ED until their evaluation is complete.     Phyliss Breen, PA-C 12/02/23 2030

## 2023-12-03 ENCOUNTER — Emergency Department
Admission: EM | Admit: 2023-12-03 | Discharge: 2023-12-03 | Disposition: A | Discharge status: Home / Self Care | Attending: Emergency Medicine | Admitting: Emergency Medicine

## 2023-12-03 DIAGNOSIS — R002 Palpitations: Secondary | ICD-10-CM

## 2023-12-03 LAB — TSH: TSH: 4.024 u[IU]/mL (ref 0.350–4.500)

## 2023-12-03 NOTE — ED Provider Notes (Signed)
 [facility] Provider Note    Event Date/Time   First MD Initiated Contact with Patient 12/03/23 0130     (approximate)   History   Chest Pain and Palpitations   HPI  Tiffany Bradford is a 53 y.o. female who presents to the ED for evaluation of Chest Pain and Palpitations   Review of cardiology clinic visit from 5/12, [facility] ED visit from 6/6.  History of anxiety and palpitations, PVCs on Holter monitor, hypertension.  Patient presents to the ED after an episode of palpitations and chest pressure that occurred earlier this afternoon after laughing.  Reports being with friends, feeling okay and having a very big laugh and developed palpitations and hollow sensation in her chest lasted a few minutes before resolving.   Physical Exam   Triage Vital Signs: ED Triage Vitals  Encounter Vitals Group     BP 12/02/23 2030 (!) 172/88     Systolic BP Percentile --      Diastolic BP Percentile --      Pulse Rate 12/02/23 2030 85     Resp 12/02/23 2030 17     Temp 12/02/23 2030 97.9 F (36.6 C)     Temp Source 12/02/23 2030 Oral     SpO2 12/02/23 2030 100 %     Weight 12/02/23 2032 122 lb (55.3 kg)     Height 12/02/23 2032 5' (1.524 m)     Head Circumference --      Peak Flow --      Pain Score 12/02/23 2031 3     Pain Loc --      Pain Education --      Exclude from Growth Chart --     Most recent vital signs: Vitals:   12/02/23 2030 12/02/23 2257  BP: (!) 172/88 (!) 147/75  Pulse: 85 (!) 57  Resp: 17 15  Temp: 97.9 F (36.6 C) 98.4 F (36.9 C)  SpO2: 100% 100%    General: Awake, no distress.  CV:  Good peripheral perfusion.  RRR without appreciable murmur Resp:  Normal effort.  Abd:  No distention.  MSK:  No deformity noted.  Neuro:  No focal deficits appreciated. Other:     ED Results / Procedures / Treatments   Labs (all labs ordered are listed, but only abnormal results are displayed) Labs Reviewed  BASIC METABOLIC PANEL  WITH GFR - Abnormal; Notable for the following components:      Result Value   Sodium 134 (*)    Glucose, Bld 145 (*)    All other components within normal limits  CBC WITH DIFFERENTIAL/PLATELET - Abnormal; Notable for the following components:   RBC 3.86 (*)    Lymphs Abs 5.2 (*)    All other components within normal limits  TSH  TROPONIN I (HIGH SENSITIVITY)  TROPONIN I (HIGH SENSITIVITY)    EKG Sinus rhythm with a rate of 85 bpm.  Normal axis and intervals without clear signs of acute ischemia.  RADIOLOGY CXR interpreted by me without evidence of acute cardiopulmonary pathology.  Official radiology report(s): DG Chest 2 View Result Date: 12/02/2023 CLINICAL DATA:  Palpitations and chest pain EXAM: CHEST - 2 VIEW COMPARISON:  12/03/2022 FINDINGS: The heart size and mediastinal contours are within normal limits. Both lungs are clear. The visualized skeletal structures are unremarkable. IMPRESSION: No active cardiopulmonary disease. Electronically Signed   By: Rozell Cornet M.D.   On: 12/02/2023 21:05    PROCEDURES and INTERVENTIONS:  .1-3  Lead EKG Interpretation  Performed by: Arline Bennett, MD Authorized by: Arline Bennett, MD     Interpretation: normal     ECG rate:  61   ECG rate assessment: normal     Rhythm: sinus rhythm     Ectopy: none     Conduction: normal     Medications - No data to display   IMPRESSION / MDM / ASSESSMENT AND PLAN / ED COURSE  I reviewed the triage vital signs and the nursing notes.  Differential diagnosis includes, but is not limited to, cardiac dysrhythmia, anxiety, electrolyte derangement, ACS  {Patient presents with symptoms of an acute illness or injury that is potentially life-threatening.  Patient presents after an episode of palpitations without evidence of acute pathology and suitable for outpatient management.  No dysrhythmias on the monitor.  EKG is nonischemic.  2 negative troponins, normal electrolytes and CBC.  Suitable for  outpatient management.      FINAL CLINICAL IMPRESSION(S) / ED DIAGNOSES   Final diagnoses:  Palpitations     Rx / DC Orders   ED Discharge Orders     None        Note:  This document was prepared using Dragon voice recognition software and may include unintentional dictation errors.   Arline Bennett, MD 12/03/23 [phone]

## 2023-12-05 DIAGNOSIS — N951 Menopausal and female climacteric states: Secondary | ICD-10-CM | POA: Diagnosis not present

## 2023-12-05 DIAGNOSIS — R002 Palpitations: Secondary | ICD-10-CM | POA: Diagnosis not present

## 2023-12-05 DIAGNOSIS — I493 Ventricular premature depolarization: Secondary | ICD-10-CM | POA: Diagnosis not present

## 2023-12-05 DIAGNOSIS — I10 Essential (primary) hypertension: Secondary | ICD-10-CM | POA: Diagnosis not present

## 2023-12-16 ENCOUNTER — Other Ambulatory Visit: Payer: Self-pay

## 2023-12-17 ENCOUNTER — Other Ambulatory Visit: Payer: Self-pay

## 2023-12-17 MED ORDER — CLONAZEPAM 0.5 MG PO TABS
0.5000 mg | ORAL_TABLET | Freq: Every day | ORAL | 2 refills | Status: AC
Start: 2023-12-17 — End: ?
  Filled 2023-12-17: qty 45, 30d supply, fill #0
  Filled 2024-01-23: qty 45, 30d supply, fill #1
  Filled 2024-02-27: qty 45, 30d supply, fill #2

## 2023-12-18 ENCOUNTER — Other Ambulatory Visit: Payer: Self-pay

## 2023-12-30 ENCOUNTER — Other Ambulatory Visit: Payer: Self-pay

## 2023-12-31 ENCOUNTER — Other Ambulatory Visit: Payer: Self-pay

## 2024-01-01 ENCOUNTER — Other Ambulatory Visit: Payer: Self-pay

## 2024-01-02 ENCOUNTER — Other Ambulatory Visit: Payer: Self-pay

## 2024-01-14 DIAGNOSIS — R002 Palpitations: Secondary | ICD-10-CM | POA: Diagnosis not present

## 2024-01-14 DIAGNOSIS — I493 Ventricular premature depolarization: Secondary | ICD-10-CM | POA: Diagnosis not present

## 2024-01-16 DIAGNOSIS — N951 Menopausal and female climacteric states: Secondary | ICD-10-CM | POA: Diagnosis not present

## 2024-01-23 ENCOUNTER — Other Ambulatory Visit: Payer: Self-pay

## 2024-01-23 ENCOUNTER — Emergency Department
Admission: EM | Admit: 2024-01-23 | Discharge: 2024-01-23 | Disposition: A | Discharge status: Home / Self Care | Source: Ambulatory Visit | Attending: Emergency Medicine | Admitting: Emergency Medicine

## 2024-01-23 DIAGNOSIS — R002 Palpitations: Secondary | ICD-10-CM | POA: Insufficient documentation

## 2024-01-23 DIAGNOSIS — E039 Hypothyroidism, unspecified: Secondary | ICD-10-CM | POA: Insufficient documentation

## 2024-01-23 DIAGNOSIS — R55 Syncope and collapse: Secondary | ICD-10-CM | POA: Insufficient documentation

## 2024-01-23 DIAGNOSIS — I10 Essential (primary) hypertension: Secondary | ICD-10-CM | POA: Insufficient documentation

## 2024-01-23 LAB — COMPREHENSIVE METABOLIC PANEL WITH GFR
ALT: 21 U/L (ref 0–44)
AST: 22 U/L (ref 15–41)
Albumin: 4 g/dL (ref 3.5–5.0)
Alkaline Phosphatase: 94 U/L (ref 38–126)
Anion gap: 8 (ref 5–15)
BUN: 15 mg/dL (ref 6–20)
CO2: 25 mmol/L (ref 22–32)
Calcium: 9.4 mg/dL (ref 8.9–10.3)
Chloride: 104 mmol/L (ref 98–111)
Creatinine, Ser: 0.66 mg/dL (ref 0.44–1.00)
GFR, Estimated: >60 mL/min (ref >60)
Glucose, Bld: 83 mg/dL (ref 70–99)
Potassium: 3.7 mmol/L (ref 3.5–5.1)
Sodium: 137 mmol/L (ref 135–145)
Total Bilirubin: 0.8 mg/dL (ref 0.0–1.2)
Total Protein: 8.1 g/dL (ref 6.5–8.1)

## 2024-01-23 LAB — CBC
HCT: 36.9 % (ref 36.0–46.0)
Hemoglobin: 12.7 g/dL (ref 12.0–15.0)
MCH: 33.1 pg (ref 26.0–34.0)
MCHC: 34.4 g/dL (ref 30.0–36.0)
MCV: 96.1 fL (ref 80.0–100.0)
Platelets: 230 K/uL (ref 150–400)
RBC: 3.84 MIL/uL — ABNORMAL LOW (ref 3.87–5.11)
RDW: 12.2 % (ref 11.5–15.5)
WBC: 7.1 K/uL (ref 4.0–10.5)
nRBC: 0 % (ref 0.0–0.2)

## 2024-01-23 LAB — URINALYSIS, ROUTINE W REFLEX MICROSCOPIC
Bilirubin Urine: NEGATIVE
Glucose, UA: NEGATIVE mg/dL
Hgb urine dipstick: NEGATIVE
Ketones, ur: NEGATIVE mg/dL
Leukocytes,Ua: NEGATIVE
Nitrite: NEGATIVE
Protein, ur: NEGATIVE mg/dL
Specific Gravity, Urine: 1.003 — ABNORMAL LOW (ref 1.005–1.030)
pH: 6 (ref 5.0–8.0)

## 2024-01-23 LAB — PREGNANCY, URINE: Preg Test, Ur: NEGATIVE

## 2024-01-23 LAB — TROPONIN I (HIGH SENSITIVITY): Troponin I (High Sensitivity): 5 ng/L (ref <18)

## 2024-01-23 NOTE — ED Provider Notes (Signed)
 [facility] Provider Note    Event Date/Time   First MD Initiated Contact with Patient 01/23/24 1355     (approximate)   History   Chief Complaint Near Syncope   HPI  Tiffany Bradford is a 53 y.o. female with past medical history of hypertension and hypothyroidism who presents to the ED complaining of near syncope.  History is limited as patient is Spanish-speaking, history obtained via interpreter [phone].  Patient reports that she was at work today when she began to feel dizzy and lightheaded, like she was going to pass out.  She reports feeling like her heart was racing at that time, but she did not have any pain in her chest or difficulty breathing.  Episode lasted for about 20 to 30 seconds before resolving on its own and she now states that she feels back to normal.  She does report that she has been having intermittent palpitations for a couple of weeks now, had been wearing a Holter monitor but left at home today to charge it.     Physical Exam   Triage Vital Signs: ED Triage Vitals  Encounter Vitals Group     BP 01/23/24 1224 (!) 163/84     Girls Systolic BP Percentile --      Girls Diastolic BP Percentile --      Boys Systolic BP Percentile --      Boys Diastolic BP Percentile --      Pulse Rate 01/23/24 1224 66     Resp 01/23/24 1224 20     Temp 01/23/24 1224 97.7 F (36.5 C)     Temp Source 01/23/24 1224 Oral     SpO2 01/23/24 1224 100 %     Weight 01/23/24 1219 127 lb 9.6 oz (57.9 kg)     Height 01/23/24 1219 5' 1 (1.549 m)     Head Circumference --      Peak Flow --      Pain Score 01/23/24 1224 0     Pain Loc --      Pain Education --      Exclude from Growth Chart --     Most recent vital signs: Vitals:   01/23/24 1224  BP: (!) 163/84  Pulse: 66  Resp: 20  Temp: 97.7 F (36.5 C)  SpO2: 100%    Constitutional: Alert and oriented. Eyes: Conjunctivae are normal. Head: Atraumatic. Nose: No  congestion/rhinnorhea. Mouth/Throat: Mucous membranes are moist.  Cardiovascular: Normal rate, regular rhythm. Grossly normal heart sounds.  2+ radial pulses bilaterally. Respiratory: Normal respiratory effort.  No retractions. Lungs CTAB. Gastrointestinal: Soft and nontender. No distention. Musculoskeletal: No lower extremity tenderness nor edema.  Neurologic:  Normal speech and language. No gross focal neurologic deficits are appreciated.    ED Results / Procedures / Treatments   Labs (all labs ordered are listed, but only abnormal results are displayed) Labs Reviewed  CBC - Abnormal; Notable for the following components:      Result Value   RBC 3.84 (*)    All other components within normal limits  URINALYSIS, ROUTINE W REFLEX MICROSCOPIC - Abnormal; Notable for the following components:   Color, Urine STRAW (*)    APPearance CLEAR (*)    Specific Gravity, Urine 1.003 (*)    All other components within normal limits  COMPREHENSIVE METABOLIC PANEL WITH GFR  PREGNANCY, URINE  TROPONIN I (HIGH SENSITIVITY)     EKG  ED ECG REPORT I, Carlin Palin, the attending physician,  personally viewed and interpreted this ECG.   Date: 01/23/2024  EKG Time: 12:29  Rate: 65  Rhythm: normal sinus rhythm  Axis: Normal  Intervals:none  ST&T Change: None  PROCEDURES:  Critical Care performed: No  Procedures   MEDICATIONS ORDERED IN ED: Medications - No data to display   IMPRESSION / MDM / ASSESSMENT AND PLAN / ED COURSE  I reviewed the triage vital signs and the nursing notes.                              53 y.o. female with past medical history of hypertension and hypothyroidism who presents to the ED complaining of dizziness, lightheadedness, and palpitations that have since resolved.  Patient's presentation is most consistent with acute presentation with potential threat to life or bodily function.  Differential diagnosis includes, but is not limited to, arrhythmia,  orthostatic hypotension, vasovagal episode, dehydration, electrolyte abnormality, AKI.  Patient nontoxic-appearing and in no acute distress, vital signs are unremarkable.  EKG shows no evidence of arrhythmia or ischemia, labs are reassuring with no significant anemia, leukocytosis, electrolyte abnormality, or AKI.  We will add on troponin and observe on cardiac monitor, low suspicion for PE given resolution of her symptoms with reassuring vital signs.  Troponin within normal limits and no events noted on cardiac monitor.  Very low suspicion for cardiac etiology for her episode today and patient is appropriate for discharge home with outpatient cardiology follow-up.  She was counseled to return to the ED for new or worsening symptoms, patient agrees with plan.      FINAL CLINICAL IMPRESSION(S) / ED DIAGNOSES   Final diagnoses:  Near syncope  Palpitations     Rx / DC Orders   ED Discharge Orders     None        Note:  This document was prepared using Dragon voice recognition software and may include unintentional dictation errors.   Willo Dunnings, MD 01/23/24 [phone]

## 2024-01-23 NOTE — ED Notes (Signed)
 See triage note Presents from work  States she had a dizzy spell while at work  [employer] like she was going to pass out Denies any pain

## 2024-01-23 NOTE — ED Triage Notes (Addendum)
 First nurse note: Brought over from [facility]  Needs interpretor. While at work, started feeling tunnel vision, losing hearing, and felt near syncope. Also reports chest tightness and palpitations.   [facility] vitals: 160/73 b/p 64HR 98% RA 97.6oral

## 2024-01-23 NOTE — ED Triage Notes (Signed)
 Patient states she was at work and began feeling dizzy and felt like she was going to pass out. Denies chest pain, numbness, slurred speech and vision changes.

## 2024-01-26 ENCOUNTER — Other Ambulatory Visit: Payer: Self-pay

## 2024-02-06 DIAGNOSIS — Z0130 Encounter for examination of blood pressure without abnormal findings: Secondary | ICD-10-CM | POA: Diagnosis not present

## 2024-02-06 DIAGNOSIS — N390 Urinary tract infection, site not specified: Secondary | ICD-10-CM | POA: Diagnosis not present

## 2024-02-06 DIAGNOSIS — Z0131 Encounter for examination of blood pressure with abnormal findings: Secondary | ICD-10-CM | POA: Diagnosis not present

## 2024-02-06 DIAGNOSIS — F411 Generalized anxiety disorder: Secondary | ICD-10-CM | POA: Diagnosis not present

## 2024-02-06 DIAGNOSIS — M79605 Pain in left leg: Secondary | ICD-10-CM | POA: Diagnosis not present

## 2024-02-06 DIAGNOSIS — Z1389 Encounter for screening for other disorder: Secondary | ICD-10-CM | POA: Diagnosis not present

## 2024-02-09 DIAGNOSIS — R002 Palpitations: Secondary | ICD-10-CM | POA: Diagnosis not present

## 2024-02-27 ENCOUNTER — Other Ambulatory Visit: Payer: Self-pay

## 2024-03-01 ENCOUNTER — Other Ambulatory Visit: Payer: Self-pay

## 2024-03-12 DIAGNOSIS — Z0000 Encounter for general adult medical examination without abnormal findings: Secondary | ICD-10-CM | POA: Diagnosis not present

## 2024-03-12 DIAGNOSIS — Z1389 Encounter for screening for other disorder: Secondary | ICD-10-CM | POA: Diagnosis not present

## 2024-03-12 DIAGNOSIS — Z0130 Encounter for examination of blood pressure without abnormal findings: Secondary | ICD-10-CM | POA: Diagnosis not present

## 2024-03-15 ENCOUNTER — Other Ambulatory Visit: Payer: Self-pay

## 2024-03-15 MED ORDER — METOPROLOL TARTRATE 50 MG PO TABS
50.0000 mg | ORAL_TABLET | Freq: Two times a day (BID) | ORAL | 3 refills | Status: AC
  Filled 2024-03-15: qty 180, 90d supply, fill #0
  Filled 2024-03-16: qty 60, 30d supply, fill #0
  Filled 2024-04-14: qty 60, 30d supply, fill #1
  Filled 2024-05-14: qty 60, 30d supply, fill #2
  Filled 2024-06-14: qty 60, 30d supply, fill #3
  Filled 2024-07-12: qty 60, 30d supply, fill #4

## 2024-03-16 ENCOUNTER — Other Ambulatory Visit: Payer: Self-pay

## 2024-04-01 ENCOUNTER — Other Ambulatory Visit: Payer: Self-pay

## 2024-04-02 ENCOUNTER — Other Ambulatory Visit: Payer: Self-pay

## 2024-04-02 MED ORDER — CLONAZEPAM 0.5 MG PO TABS
ORAL_TABLET | ORAL | 2 refills | Status: AC
  Filled 2024-04-02: qty 45, 30d supply, fill #0
  Filled 2024-05-10: qty 45, 30d supply, fill #1
  Filled 2024-06-18: qty 45, 30d supply, fill #2

## 2024-04-15 ENCOUNTER — Other Ambulatory Visit: Payer: Self-pay

## 2024-04-27 ENCOUNTER — Other Ambulatory Visit: Payer: Self-pay

## 2024-04-27 MED ORDER — LEVOTHYROXINE SODIUM 88 MCG PO TABS
88.0000 ug | ORAL_TABLET | Freq: Every day | ORAL | 2 refills | Status: AC
  Filled 2024-04-27: qty 90, 90d supply, fill #0

## 2024-04-28 ENCOUNTER — Other Ambulatory Visit: Payer: Self-pay

## 2024-04-28 DIAGNOSIS — R55 Syncope and collapse: Secondary | ICD-10-CM | POA: Diagnosis not present

## 2024-04-28 DIAGNOSIS — I493 Ventricular premature depolarization: Secondary | ICD-10-CM | POA: Diagnosis not present

## 2024-04-28 DIAGNOSIS — R002 Palpitations: Secondary | ICD-10-CM | POA: Diagnosis not present

## 2024-05-10 ENCOUNTER — Other Ambulatory Visit: Payer: Self-pay

## 2024-05-10 DIAGNOSIS — R002 Palpitations: Secondary | ICD-10-CM | POA: Diagnosis not present

## 2024-05-10 DIAGNOSIS — R55 Syncope and collapse: Secondary | ICD-10-CM | POA: Diagnosis not present

## 2024-05-12 ENCOUNTER — Other Ambulatory Visit: Payer: Self-pay

## 2024-05-18 DIAGNOSIS — Z0131 Encounter for examination of blood pressure with abnormal findings: Secondary | ICD-10-CM | POA: Diagnosis not present

## 2024-05-18 DIAGNOSIS — M2550 Pain in unspecified joint: Secondary | ICD-10-CM | POA: Diagnosis not present

## 2024-05-18 DIAGNOSIS — M79671 Pain in right foot: Secondary | ICD-10-CM | POA: Diagnosis not present

## 2024-05-18 DIAGNOSIS — Z1389 Encounter for screening for other disorder: Secondary | ICD-10-CM | POA: Diagnosis not present

## 2024-05-18 DIAGNOSIS — E039 Hypothyroidism, unspecified: Secondary | ICD-10-CM | POA: Diagnosis not present

## 2024-05-18 DIAGNOSIS — Z0130 Encounter for examination of blood pressure without abnormal findings: Secondary | ICD-10-CM | POA: Diagnosis not present

## 2024-05-18 DIAGNOSIS — I10 Essential (primary) hypertension: Secondary | ICD-10-CM | POA: Diagnosis not present

## 2024-05-18 DIAGNOSIS — R7989 Other specified abnormal findings of blood chemistry: Secondary | ICD-10-CM | POA: Diagnosis not present

## 2024-05-27 ENCOUNTER — Other Ambulatory Visit: Payer: Self-pay

## 2024-05-27 DIAGNOSIS — Z03818 Encounter for observation for suspected exposure to other biological agents ruled out: Secondary | ICD-10-CM | POA: Diagnosis not present

## 2024-05-27 DIAGNOSIS — J020 Streptococcal pharyngitis: Secondary | ICD-10-CM | POA: Diagnosis not present

## 2024-05-27 DIAGNOSIS — J209 Acute bronchitis, unspecified: Secondary | ICD-10-CM | POA: Diagnosis not present

## 2024-05-27 DIAGNOSIS — B9689 Other specified bacterial agents as the cause of diseases classified elsewhere: Secondary | ICD-10-CM | POA: Diagnosis not present

## 2024-05-27 DIAGNOSIS — J029 Acute pharyngitis, unspecified: Secondary | ICD-10-CM | POA: Diagnosis not present

## 2024-05-27 DIAGNOSIS — J019 Acute sinusitis, unspecified: Secondary | ICD-10-CM | POA: Diagnosis not present

## 2024-05-27 MED ORDER — PROMETHAZINE-DM 6.25-15 MG/5ML PO SYRP
5.0000 mL | ORAL_SOLUTION | Freq: Four times a day (QID) | ORAL | 0 refills | Status: AC | PRN
  Filled 2024-05-27: qty 60, 3d supply, fill #0

## 2024-05-27 MED ORDER — AZITHROMYCIN 250 MG PO TABS
ORAL_TABLET | ORAL | 0 refills | Status: AC
  Filled 2024-05-27: qty 6, 5d supply, fill #0

## 2024-05-27 MED ORDER — PREDNISONE 20 MG PO TABS
20.0000 mg | ORAL_TABLET | Freq: Two times a day (BID) | ORAL | 0 refills | Status: AC
  Filled 2024-05-27: qty 10, 5d supply, fill #0

## 2024-05-31 ENCOUNTER — Ambulatory Visit
Admission: RE | Admit: 2024-05-31 | Discharge: 2024-05-31 | Disposition: A | Source: Ambulatory Visit | Attending: Family Medicine | Admitting: Family Medicine

## 2024-05-31 ENCOUNTER — Ambulatory Visit
Admission: RE | Admit: 2024-05-31 | Discharge: 2024-05-31 | Disposition: A | Attending: Family Medicine | Admitting: Family Medicine

## 2024-05-31 ENCOUNTER — Other Ambulatory Visit: Payer: Self-pay | Admitting: Family Medicine

## 2024-05-31 DIAGNOSIS — M25571 Pain in right ankle and joints of right foot: Secondary | ICD-10-CM | POA: Diagnosis not present

## 2024-05-31 DIAGNOSIS — Q6671 Congenital pes cavus, right foot: Secondary | ICD-10-CM | POA: Diagnosis not present

## 2024-06-14 ENCOUNTER — Other Ambulatory Visit: Payer: Self-pay

## 2024-06-18 ENCOUNTER — Other Ambulatory Visit: Payer: Self-pay

## 2024-06-23 ENCOUNTER — Other Ambulatory Visit: Payer: Self-pay

## 2024-06-23 MED ORDER — NAPROXEN 500 MG PO TABS
500.0000 mg | ORAL_TABLET | Freq: Two times a day (BID) | ORAL | 0 refills | Status: AC | PRN
  Filled 2024-06-23: qty 30, 15d supply, fill #0

## 2024-07-05 ENCOUNTER — Other Ambulatory Visit: Payer: Self-pay
